# Patient Record
Sex: Female | Born: 1990
Health system: Southern US, Community
[De-identification: ages and names within clinical notes are randomized; demographics above are authoritative.]

## PROBLEM LIST (undated history)

## (undated) ENCOUNTER — Inpatient Hospital Stay (HOSPITAL_COMMUNITY): Payer: Self-pay

## (undated) DIAGNOSIS — F419 Anxiety disorder, unspecified: Secondary | ICD-10-CM

## (undated) DIAGNOSIS — Z9889 Other specified postprocedural states: Secondary | ICD-10-CM

## (undated) DIAGNOSIS — F32A Depression, unspecified: Secondary | ICD-10-CM

## (undated) DIAGNOSIS — R112 Nausea with vomiting, unspecified: Secondary | ICD-10-CM

## (undated) DIAGNOSIS — F329 Major depressive disorder, single episode, unspecified: Secondary | ICD-10-CM

## (undated) HISTORY — PX: CHOLECYSTECTOMY: SHX55

## (undated) HISTORY — PX: GALLBLADDER SURGERY: SHX652

---
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
## 2009-04-23 ENCOUNTER — Emergency Department (HOSPITAL_COMMUNITY)
Admission: EM | Admit: 2009-04-23 | Discharge: 2009-04-23 | Payer: Self-pay | Source: Home / Self Care | Admitting: Emergency Medicine

## 2010-02-13 ENCOUNTER — Ambulatory Visit (HOSPITAL_COMMUNITY)
Admission: RE | Admit: 2010-02-13 | Discharge: 2010-02-13 | Payer: Self-pay | Source: Home / Self Care | Attending: Obstetrics & Gynecology | Admitting: Obstetrics & Gynecology

## 2010-05-13 LAB — URINALYSIS, ROUTINE W REFLEX MICROSCOPIC
Bilirubin Urine: NEGATIVE
Hgb urine dipstick: NEGATIVE
Specific Gravity, Urine: 1.02 (ref 1.005–1.030)
pH: 7 (ref 5.0–8.0)

## 2010-05-13 LAB — GC/CHLAMYDIA PROBE AMP, GENITAL: Chlamydia, DNA Probe: POSITIVE — AB

## 2010-05-13 LAB — WET PREP, GENITAL: Yeast Wet Prep HPF POC: NONE SEEN

## 2010-05-13 LAB — PREGNANCY, URINE: Preg Test, Ur: NEGATIVE

## 2011-01-10 ENCOUNTER — Emergency Department (HOSPITAL_COMMUNITY)
Admission: EM | Admit: 2011-01-10 | Discharge: 2011-01-10 | Discharge status: Against Medical Advice | Payer: Self-pay | Attending: Emergency Medicine | Admitting: Emergency Medicine

## 2011-01-10 ENCOUNTER — Encounter: Payer: Self-pay | Admitting: *Deleted

## 2011-01-10 DIAGNOSIS — Z0389 Encounter for observation for other suspected diseases and conditions ruled out: Secondary | ICD-10-CM | POA: Insufficient documentation

## 2011-01-10 NOTE — ED Notes (Signed)
Pt reports being dx with gallstone Monday-was given referral to a surgeon-states calling the office-reports the office is closed.  Pt reports n/v and abd pain is getting worse.  Pt reports vomiting x 3 today.  Pt crying stating "i just want it taken out."  Pt reports she's unable to sleep at night d/t the pain.

## 2011-06-17 ENCOUNTER — Encounter (HOSPITAL_COMMUNITY): Payer: Self-pay | Admitting: *Deleted

## 2011-06-17 ENCOUNTER — Emergency Department (HOSPITAL_COMMUNITY): Payer: Self-pay

## 2011-06-17 ENCOUNTER — Emergency Department (HOSPITAL_COMMUNITY)
Admission: EM | Admit: 2011-06-17 | Discharge: 2011-06-17 | Disposition: A | Discharge status: Home / Self Care | Payer: Self-pay | Attending: Emergency Medicine | Admitting: Emergency Medicine

## 2011-06-17 DIAGNOSIS — M549 Dorsalgia, unspecified: Secondary | ICD-10-CM | POA: Insufficient documentation

## 2011-06-17 DIAGNOSIS — N898 Other specified noninflammatory disorders of vagina: Secondary | ICD-10-CM | POA: Insufficient documentation

## 2011-06-17 DIAGNOSIS — R1032 Left lower quadrant pain: Secondary | ICD-10-CM | POA: Insufficient documentation

## 2011-06-17 DIAGNOSIS — N939 Abnormal uterine and vaginal bleeding, unspecified: Secondary | ICD-10-CM

## 2011-06-17 DIAGNOSIS — R10819 Abdominal tenderness, unspecified site: Secondary | ICD-10-CM | POA: Insufficient documentation

## 2011-06-17 LAB — URINALYSIS, ROUTINE W REFLEX MICROSCOPIC
Bilirubin Urine: NEGATIVE
Ketones, ur: NEGATIVE mg/dL
Nitrite: NEGATIVE
Urobilinogen, UA: 1 mg/dL (ref 0.0–1.0)
pH: 6 (ref 5.0–8.0)

## 2011-06-17 LAB — URINE MICROSCOPIC-ADD ON

## 2011-06-17 LAB — WET PREP, GENITAL: Clue Cells Wet Prep HPF POC: NONE SEEN

## 2011-06-17 MED ORDER — OXYCODONE-ACETAMINOPHEN 5-325 MG PO TABS
1.0000 | ORAL_TABLET | Freq: Once | ORAL | Status: AC
  Administered 2011-06-17: 1 via ORAL
  Filled 2011-06-17: qty 1

## 2011-06-17 MED ORDER — OXYCODONE-ACETAMINOPHEN 5-325 MG PO TABS: 1.0000 | ORAL_TABLET | Freq: Four times a day (QID) | ORAL | Status: AC | PRN

## 2011-06-17 NOTE — ED Provider Notes (Signed)
History     CSN: [id number]  Arrival date & time 06/17/11  1335   First MD Initiated Contact with Patient 06/17/11 1347      No chief complaint on file.   (Consider location/radiation/quality/duration/timing/severity/associated sxs/prior treatment) Patient is a 21 y.o. female presenting with vaginal bleeding. The history is provided by the patient.  Vaginal Bleeding This is a recurrent problem. The current episode started in the past 7 days. The problem occurs intermittently. The problem has been unchanged. Associated symptoms include abdominal pain. Pertinent negatives include no chest pain, fatigue, headaches, nausea, neck pain, rash, vertigo, vomiting or weakness. Associated symptoms comments: No dizziness, syncope, dyspnea . The symptoms are aggravated by nothing. She has tried nothing for the symptoms. The treatment provided no relief.    History reviewed. No pertinent past medical history.  Past Surgical History  Procedure Date  . Appendectomy     No family history on file.  History  Substance Use Topics  . Smoking status: Current Everyday Smoker    Types: Cigarettes  . Smokeless tobacco: Not on file  . Alcohol Use: Yes     occasionally    OB History    Grav Para Term Preterm Abortions TAB SAB Ect Mult Living                  Review of Systems  Constitutional: Negative for fatigue.  HENT: Negative for neck pain.   Respiratory: Negative for shortness of breath.   Cardiovascular: Negative for chest pain.  Gastrointestinal: Positive for abdominal pain. Negative for nausea, vomiting and diarrhea.  Genitourinary: Positive for vaginal bleeding. Negative for dysuria.  Musculoskeletal: Positive for back pain (with cramps).  Skin: Negative for rash.  Neurological: Negative for vertigo, weakness and headaches.  All other systems reviewed and are negative.    Allergies  Amoxicillin and Penicillins  Home Medications   Current Outpatient Rx  Name Route Sig  Dispense Refill  . IBUPROFEN 800 MG PO TABS Oral Take 800 mg by mouth every 4 (four) hours as needed. As needed for pain.      BP 134/92  Pulse 115  Temp(Src) 97.7 F (36.5 C) (Oral)  Resp 22  SpO2 100%  Physical Exam  Nursing note and vitals reviewed. Constitutional: She is oriented to person, place, and time. She appears well-developed and well-nourished.  HENT:  Head: Normocephalic and atraumatic.  Eyes: EOM are normal.  Neck: Normal range of motion.  Cardiovascular: Normal rate, regular rhythm and normal heart sounds.   Pulmonary/Chest: Effort normal and breath sounds normal. No respiratory distress.  Abdominal: Soft. She exhibits no distension. There is tenderness in the suprapubic area. There is no rigidity, no guarding and no CVA tenderness.  Genitourinary: There is no lesion on the right labia. There is no lesion on the left labia. Uterus is not tender. Cervix exhibits no motion tenderness. Right adnexum displays no mass and no tenderness. Left adnexum displays tenderness. Left adnexum displays no mass. There is bleeding around the vagina.  Musculoskeletal: Normal range of motion.  Neurological: She is alert and oriented to person, place, and time.  Skin: Skin is warm and dry.  Psychiatric: She has a normal mood and affect.    ED Course  Procedures (including critical care time)  Labs Reviewed  URINALYSIS, ROUTINE W REFLEX MICROSCOPIC - Abnormal; Notable for the following:    Hgb urine dipstick SMALL (*)    All other components within normal limits  WET PREP, GENITAL - Abnormal; Notable for  the following:    WBC, Wet Prep HPF POC FEW (*)    All other components within normal limits  POCT PREGNANCY, URINE  URINE MICROSCOPIC-ADD ON  GC/CHLAMYDIA PROBE AMP, GENITAL   No results found.   1. Vaginal bleeding       MDM   Pt with h/o D&C mid Feb for nonviable IUP at [redacted]weeks gestation presents with heavy vaginal bleeding. Bleed for 3 weeks after procedure, then had  period end of march--heavier bleeding with increased cramps. Began with bleeding again several days ago. Also heavy, pad changes Q2h. Increased cramping from prior.  Abd exam benign.   UPT neg.   Pelvic with moderate vaginal bleeding from Os. Mild adnexal TTP without fullness.  Further hx from pt indicates her pregnancy was partially in fallopian tube, unsure of side. Given this, pain, and bleeding will check U/S.   To CDU for this study.          Donnamarie Poag, MD 06/17/11 [phone]

## 2011-06-17 NOTE — ED Provider Notes (Signed)
I saw and evaluated the patient, reviewed the resident's note and I agree with the findings and plan. Patient with left lower corner abdominal pain in the vaginal area that has worsened since her D&C. Significant pain in her left adnexa today. Ultrasound pending for further evaluation  Gwyneth Sprout, MD 06/17/11 1534

## 2011-06-17 NOTE — ED Notes (Signed)
Patient transported to Ultrasound 

## 2011-06-17 NOTE — ED Provider Notes (Signed)
Ultrasound results reviewed and discussed with patient.  Her prior d&c was performed by Dr Okey Dupre in [facility], but states she is no longer covered with him due to a  recent change in insurance.  Patient referred for follow-up with unassigned GYN Ernestina Penna).  Jimmye Norman, NP 06/17/11 [phone]

## 2011-06-17 NOTE — ED Notes (Signed)
Patient with tubal pregnancy requiring D/C appox 2 months ago, patient now with regular but heavy menstrual cycles.  Patient states passing large amounts of clots and has increasing pain in left lower abdomen radiating into back,

## 2011-06-17 NOTE — ED Notes (Signed)
Report called to CDU.

## 2011-06-17 NOTE — ED Notes (Signed)
Pt had D&C in feburary and since has been having cramping and vaginal clotting.

## 2011-06-17 NOTE — Discharge Instructions (Signed)

## 2011-06-18 LAB — GC/CHLAMYDIA PROBE AMP, GENITAL
Chlamydia, DNA Probe: NEGATIVE
GC Probe Amp, Genital: NEGATIVE

## 2011-06-18 NOTE — ED Provider Notes (Signed)
Medical screening examination/treatment/procedure(s) were conducted as a shared visit with non-physician practitioner(s) and myself.  I personally evaluated the patient during the encounter   Gwyneth Sprout, MD 06/18/11 [phone]

## 2012-02-07 ENCOUNTER — Encounter (HOSPITAL_COMMUNITY): Payer: Self-pay | Admitting: Emergency Medicine

## 2012-02-07 ENCOUNTER — Emergency Department (HOSPITAL_COMMUNITY)
Admission: EM | Admit: 2012-02-07 | Discharge: 2012-02-07 | Disposition: A | Discharge status: Home / Self Care | Payer: Medicaid Other | Attending: Emergency Medicine | Admitting: Emergency Medicine

## 2012-02-07 DIAGNOSIS — F329 Major depressive disorder, single episode, unspecified: Secondary | ICD-10-CM | POA: Insufficient documentation

## 2012-02-07 DIAGNOSIS — F17200 Nicotine dependence, unspecified, uncomplicated: Secondary | ICD-10-CM | POA: Insufficient documentation

## 2012-02-07 DIAGNOSIS — F410 Panic disorder [episodic paroxysmal anxiety] without agoraphobia: Secondary | ICD-10-CM | POA: Insufficient documentation

## 2012-02-07 DIAGNOSIS — F419 Anxiety disorder, unspecified: Secondary | ICD-10-CM

## 2012-02-07 DIAGNOSIS — G479 Sleep disorder, unspecified: Secondary | ICD-10-CM | POA: Insufficient documentation

## 2012-02-07 DIAGNOSIS — F3289 Other specified depressive episodes: Secondary | ICD-10-CM | POA: Insufficient documentation

## 2012-02-07 DIAGNOSIS — R0789 Other chest pain: Secondary | ICD-10-CM | POA: Insufficient documentation

## 2012-02-07 DIAGNOSIS — R0602 Shortness of breath: Secondary | ICD-10-CM | POA: Insufficient documentation

## 2012-02-07 DIAGNOSIS — Z3202 Encounter for pregnancy test, result negative: Secondary | ICD-10-CM | POA: Insufficient documentation

## 2012-02-07 DIAGNOSIS — F411 Generalized anxiety disorder: Secondary | ICD-10-CM | POA: Insufficient documentation

## 2012-02-07 LAB — POCT PREGNANCY, URINE: Preg Test, Ur: NEGATIVE

## 2012-02-07 LAB — URINALYSIS, ROUTINE W REFLEX MICROSCOPIC
Bilirubin Urine: NEGATIVE
Glucose, UA: NEGATIVE mg/dL
Specific Gravity, Urine: 1.03 (ref 1.005–1.030)
Urobilinogen, UA: 0.2 mg/dL (ref 0.0–1.0)

## 2012-02-07 LAB — URINE MICROSCOPIC-ADD ON

## 2012-02-07 MED ORDER — DIAZEPAM 5 MG PO TABS
5.0000 mg | ORAL_TABLET | Freq: Once | ORAL | Status: AC
  Administered 2012-02-07: 5 mg via ORAL
  Filled 2012-02-07: qty 1

## 2012-02-07 MED ORDER — LORAZEPAM 1 MG PO TABS
1.0000 mg | ORAL_TABLET | Freq: Once | ORAL | Status: AC
  Administered 2012-02-07: 1 mg via ORAL
  Filled 2012-02-07: qty 1

## 2012-02-07 MED ORDER — CLONAZEPAM 0.5 MG PO TABS: 1.0000 mg | ORAL_TABLET | Freq: Two times a day (BID) | ORAL | Status: DC | PRN

## 2012-02-07 NOTE — ED Notes (Signed)
Reports feels like freking out. Patient has never experienced this before. Heard a drink "pop" then vision tunneling, then spiraled out of control. Soon as she calms down then starts up again.

## 2012-02-07 NOTE — ED Provider Notes (Signed)
History     CSN: [id number]  Arrival date & time 02/07/12  1417   First MD Initiated Contact with Patient 02/07/12 1441      Chief Complaint  Patient presents with  . Anxiety    (Consider location/radiation/quality/duration/timing/severity/associated sxs/prior treatment) HPI Comments: [name] is a 21 y.o. female with a history of anxiety and depression presents emergency department having an acute panic attack.  Symptoms began while at work and patient describes them as "freaking out and spiraling out of control."  Patient did not recall when her last anxiety/panic attack was and is not currently on any medications for depression.  She reports having increased stress, decreased sleep and worsened depression over the last couple of weeks.  Patient describes a chest tightness sensation and palpitations associated with a tach and she is currently still having the symptoms.  Patient states that she should not be pregnant as she has an  Implanon implant for birth control.   The history is provided by the patient.    History reviewed. No pertinent past medical history.  Past Surgical History  Procedure Date  . Appendectomy     History reviewed. No pertinent family history.  History  Substance Use Topics  . Smoking status: Current Every Day Smoker    Types: Cigarettes  . Smokeless tobacco: Not on file  . Alcohol Use: Yes     Comment: occasionally    OB History    Grav Para Term Preterm Abortions TAB SAB Ect Mult Living                  Review of Systems  Constitutional: Negative for fever, chills and appetite change.  HENT: Negative for congestion.   Eyes: Negative for visual disturbance.  Respiratory: Positive for chest tightness and shortness of breath.   Cardiovascular: Negative for chest pain and leg swelling.  Gastrointestinal: Negative for abdominal pain.  Genitourinary: Negative for dysuria, urgency and frequency.  Neurological: Negative for  dizziness, syncope, weakness, light-headedness, numbness and headaches.  Psychiatric/Behavioral: Positive for sleep disturbance. Negative for confusion. The patient is nervous/anxious.     Allergies  Amoxicillin; Penicillins; Codeine; and Morphine and related  Home Medications  No current outpatient prescriptions on file.  BP 158/81  Pulse 111  Temp 98.1 F (36.7 C) (Oral)  Resp 28  Ht 5\' 4"  (1.626 m)  Wt 160 lb (72.576 kg)  BMI 27.46 kg/m2  SpO2 100%  Physical Exam  Nursing note and vitals reviewed. Constitutional: She appears well-developed and well-nourished. She appears distressed.       Tearful   HENT:  Head: Normocephalic and atraumatic.  Eyes: Conjunctivae normal and EOM are normal. Pupils are equal, round, and reactive to light.  Neck: Normal range of motion. Neck supple. Normal carotid pulses and no JVD present. Carotid bruit is not present. No rigidity. Normal range of motion present.  Cardiovascular: Regular rhythm, S1 normal, S2 normal, normal heart sounds, intact distal pulses and normal pulses.  Exam reveals no gallop and no friction rub.   No murmur heard.      Tachycardia, No pitting edema bilaterally, no aberrant sounds on auscultations, distal pulses intact, no carotid bruit or JVD.   Pulmonary/Chest: Effort normal and breath sounds normal. No accessory muscle usage or stridor. No respiratory distress. She exhibits no tenderness and no bony tenderness.  Abdominal: Bowel sounds are normal.       Soft non tender. Non pulsatile aorta.   Skin: Skin is warm, dry and  intact. No rash noted. She is not diaphoretic. No cyanosis. Nails show no clubbing.  Psychiatric: Her speech is normal. Her mood appears anxious. She exhibits a depressed mood. She expresses no homicidal and no suicidal ideation.    ED Course  Procedures (including critical care time)   Labs Reviewed  POCT PREGNANCY, URINE  URINALYSIS, ROUTINE W REFLEX MICROSCOPIC   No results found.   No  diagnosis found.    MDM  Patient presents to the emergency department complaining of symptoms consistent with anxiety.  Patient has a history of same with similar episodes.  Pt was on antidepressent in past but has not taked over the last couple of years and requests resource guide. The patient is resting comfortably, in no apparent distress and asymptomatic.  No exophthalmos, pregnancy test negative, no signs of UTI.  Stress reducing mechanisms discussed including caffeine intake.  Patient has been referred to psychiatric services for follow-up.  Discharged with a 3 day prescription for Ativan 1mg .             Jaci Carrel, [place] 02/07/12 1650

## 2012-02-08 NOTE — ED Provider Notes (Signed)
Medical screening examination/treatment/procedure(s) were performed by non-physician practitioner and as supervising physician I was immediately available for consultation/collaboration.   Macky Galik, MD 02/08/12 0734 

## 2012-02-09 ENCOUNTER — Ambulatory Visit (HOSPITAL_COMMUNITY)
Admission: RE | Admit: 2012-02-09 | Discharge: 2012-02-09 | Disposition: A | Discharge status: Home / Self Care | Payer: Medicaid Other | Attending: Psychiatry | Admitting: Psychiatry

## 2012-02-09 ENCOUNTER — Encounter (HOSPITAL_COMMUNITY): Payer: Self-pay | Admitting: Behavioral Health

## 2012-02-09 DIAGNOSIS — F3289 Other specified depressive episodes: Secondary | ICD-10-CM | POA: Insufficient documentation

## 2012-02-09 DIAGNOSIS — F329 Major depressive disorder, single episode, unspecified: Secondary | ICD-10-CM | POA: Insufficient documentation

## 2012-02-09 HISTORY — DX: Major depressive disorder, single episode, unspecified: F32.9

## 2012-02-09 HISTORY — DX: Depression, unspecified: F32.A

## 2012-02-09 NOTE — BH Assessment (Signed)
Assessment Note   [name] is an 21 y.o. female that arrived to [facility] for an assessment following a [facility] admission 12/21 for panic, anxiety symptoms and was prescribed clonazepam .05mg . Pt is oriented x's 4, calm and cooperative. Pt reports that she was referred to [facility] iop services to address her depression and anxiety that is currently increasing in intensity. Pt denies SI, HI, AV, delusions, psychosis, sexual, physical, emotional and sa abuse. Pt reports that while at work she experienced a "pop" sound and a "tunneling/narrowing" effect with her vision and said "I've never experienced that before". Pt reports depressive symptoms of insomnia (3/24 hrs), tearful, fatigue, loss of interest in pleasures and irritable and decreased concentration. Pt reports that she remembers being dx'd with depression at about 21 yo. Pt confirms intact recent/remote memory, good appetite, zero wght loss and a 6-7 lb gain in 30 days. 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   Pt reports her paternal grandma (raised her) passed 2 yrs ago and she never received grief therapy afterwards. Pt reports after having her son 5 yrs ago she experienced post-partum depression and she never received the diagnosis specific therapy. Pt reports that she was married at 66, separated during the last 5 yrs and is newly reunited with her husband. Pt reports that she is concerned about paying her bills and she becomes overwhelmed. Pt reports that she is off the next 2 days and said "it might not be much for most people, but I need my 2 days pay". Pt reports that she is eager to address the cause of her growing depression and anxiety.  Axis I: Depressive Disorder NOS Axis II: Deferred Axis III:  Past Medical History  Diagnosis Date  . Depression    Axis IV: occupational problems, problems related to social environment and problems with access to health care  services Axis V: 41-50 serious symptoms  Past Medical History:  Past Medical History  Diagnosis Date  . Depression     Past Surgical History  Procedure Date  . Appendectomy     Family History: No family history on file.  Social History:  reports that she has been smoking Cigarettes.  She does not have any smokeless tobacco history on file. She reports that she drinks alcohol. She reports that she does not use illicit drugs.  Additional Social History:  Alcohol / Drug Use Pain Medications: none Prescriptions: none Over the Counter: none History of alcohol / drug use?: No history of alcohol / drug abuse  CIWA: CIWA-Ar Nausea and Vomiting: no nausea and no vomiting Tactile Disturbances: none Tremor: no tremor Auditory Disturbances: not present Paroxysmal Sweats: no sweat visible Visual Disturbances: not present Anxiety: no anxiety, at ease Headache, Fullness in Head: none present Agitation: normal activity Orientation and Clouding of Sensorium: oriented and can do serial additions CIWA-Ar Total: 0  COWS: Clinical Opiate Withdrawal Scale (COWS) Resting Pulse Rate: Pulse Rate 80 or below Sweating: No report of chills or flushing Restlessness: Able to sit still Pupil Size: Pupils pinned or normal size for room light Bone or Joint Aches: Not present Runny Nose or Tearing: Not present GI Upset: No GI symptoms Tremor: No tremor Yawning: No yawning Anxiety or Irritability: None Gooseflesh Skin: Skin is smooth COWS Total Score: 0   Allergies:  Allergies  Allergen Reactions  . Amoxicillin Anaphylaxis  . Penicillins Anaphylaxis  . Codeine Itching  . Morphine And Related Itching  Home Medications:  (Not in a hospital admission)  OB/GYN Status:  No LMP recorded.  General Assessment Data Location of Assessment: [facility] Assessment Services Living Arrangements: Spouse/significant other;Children Can pt return to current living arrangement?: Yes Admission Status:  Voluntary Is patient capable of signing voluntary admission?: Yes Transfer from: Home Referral Source: Self/Family/Friend  Education Status Is patient currently in school?: No  Risk to self Suicidal Ideation: No Suicidal Intent: No Is patient at risk for suicide?: No Suicidal Plan?: No Access to Means: No What has been your use of drugs/alcohol within the last 12 months?:  (none) Previous Attempts/Gestures: No Other Self Harm Risks:  (none) Triggers for Past Attempts: None known Intentional Self Injurious Behavior: None Family Suicide History: No Recent stressful life event(s): Conflict (Comment);Other (Comment) (newly reunited w/husband, stress at work) Persecutory voices/beliefs?: No Depression: Yes Depression Symptoms: Insomnia;Tearfulness;Fatigue;Loss of interest in usual pleasures;Feeling angry/irritable (3/24 hrs, not participating w/friends, irritable) Substance abuse history and/or treatment for substance abuse?: No Suicide prevention information given to non-admitted patients: Not applicable  Risk to Others Homicidal Ideation: No Thoughts of Harm to Others: No Current Homicidal Intent: No Current Homicidal Plan: No Access to Homicidal Means: No Identified Victim:  (none) History of harm to others?: No Assessment of Violence: None Noted Violent Behavior Description:  (calm and cooperative) Does patient have access to weapons?: No Criminal Charges Pending?: No Does patient have a court date: No  Psychosis Hallucinations: None noted Delusions: None noted  Mental Status Report Appear/Hygiene:  (casual) Eye Contact: Good Motor Activity: Freedom of movement Speech: Logical/coherent Level of Consciousness: Alert;Quiet/awake Mood: Depressed Affect: Appropriate to circumstance Anxiety Level: Minimal Thought Processes: Coherent;Relevant Judgement: Unimpaired Orientation: Person;Place;Time;Situation;Appropriate for developmental age Obsessive Compulsive  Thoughts/Behaviors: None  Cognitive Functioning Concentration: Decreased Memory: Recent Intact;Remote Intact IQ: Average Insight: Good Impulse Control: Good Appetite: Good Weight Loss:  (0) Weight Gain:  (0) Sleep: Decreased Total Hours of Sleep:  (3/24) Vegetative Symptoms: None  ADLScreening [facility] Assessment Services) Patient's cognitive ability adequate to safely complete daily activities?: Yes Patient able to express need for assistance with ADLs?: Yes Independently performs ADLs?: Yes (appropriate for developmental age)  Abuse/Neglect [facility]) Physical Abuse: Denies Verbal Abuse: Denies Sexual Abuse: Denies  Prior Inpatient Therapy Prior Inpatient Therapy: No  Prior Outpatient Therapy Prior Outpatient Therapy: Yes Prior Therapy Dates:  (2003/2004) Prior Therapy Facilty/Provider(s):  ([address], pt not sure of provider name) Reason for Treatment:  (anger, depression)  ADL Screening (condition at time of admission) Patient's cognitive ability adequate to safely complete daily activities?: Yes Patient able to express need for assistance with ADLs?: Yes Independently performs ADLs?: Yes (appropriate for developmental age) Weakness of Legs: None Weakness of Arms/Hands: None  Home Assistive Devices/Equipment Home Assistive Devices/Equipment: None  Therapy Consults (therapy consults require a physician order) PT Evaluation Needed: No OT Evalulation Needed: No Abuse/Neglect Assessment (Assessment to be complete while patient is alone) Physical Abuse: Denies Verbal Abuse: Denies Sexual Abuse: Denies Exploitation of patient/patient's resources: Denies Self-Neglect: Denies Values / Beliefs Cultural Requests During Hospitalization: None Spiritual Requests During Hospitalization: None Consults Spiritual Care Consult Needed: No Social Work Consult Needed: No Merchant navy officer (For Healthcare) Advance Directive: Patient does not have advance directive Pre-existing out  of facility DNR order (yellow form or pink MOST form): No Nutrition Screen- [facility] Adult/[facility] Patient's home diet: Regular Have you recently lost weight without trying?: No Have you been eating poorly because of a decreased appetite?: No Malnutrition Screening Tool Score: 0   Additional Information 1:1  In Past 12 Months?: No CIRT Risk: No Elopement Risk: No Does patient have medical clearance?: No     Disposition: pt assessed for MH IOP after [facility] referral. Disposition Disposition of Patient: Outpatient treatment Type of outpatient treatment: Adult;Psych Intensive Outpatient  On Site Evaluation by:   Reviewed with Physician:     Manual Meier 02/09/2012 7:42 PM

## 2012-05-05 ENCOUNTER — Emergency Department (HOSPITAL_COMMUNITY): Payer: Medicaid Other

## 2012-05-05 ENCOUNTER — Emergency Department (HOSPITAL_COMMUNITY)
Admission: EM | Admit: 2012-05-05 | Discharge: 2012-05-05 | Disposition: A | Discharge status: Home / Self Care | Payer: Medicaid Other | Attending: Emergency Medicine | Admitting: Emergency Medicine

## 2012-05-05 ENCOUNTER — Encounter (HOSPITAL_COMMUNITY): Payer: Self-pay | Admitting: *Deleted

## 2012-05-05 DIAGNOSIS — Y9389 Activity, other specified: Secondary | ICD-10-CM | POA: Insufficient documentation

## 2012-05-05 DIAGNOSIS — W010XXA Fall on same level from slipping, tripping and stumbling without subsequent striking against object, initial encounter: Secondary | ICD-10-CM | POA: Insufficient documentation

## 2012-05-05 DIAGNOSIS — F17200 Nicotine dependence, unspecified, uncomplicated: Secondary | ICD-10-CM | POA: Insufficient documentation

## 2012-05-05 DIAGNOSIS — Y929 Unspecified place or not applicable: Secondary | ICD-10-CM | POA: Insufficient documentation

## 2012-05-05 DIAGNOSIS — IMO0002 Reserved for concepts with insufficient information to code with codable children: Secondary | ICD-10-CM | POA: Insufficient documentation

## 2012-05-05 DIAGNOSIS — T148XXA Other injury of unspecified body region, initial encounter: Secondary | ICD-10-CM

## 2012-05-05 DIAGNOSIS — Z8659 Personal history of other mental and behavioral disorders: Secondary | ICD-10-CM | POA: Insufficient documentation

## 2012-05-05 DIAGNOSIS — M545 Low back pain: Secondary | ICD-10-CM

## 2012-05-05 MED ORDER — IBUPROFEN 400 MG PO TABS
800.0000 mg | ORAL_TABLET | Freq: Once | ORAL | Status: AC
  Administered 2012-05-05: 800 mg via ORAL
  Filled 2012-05-05: qty 2

## 2012-05-05 MED ORDER — METHOCARBAMOL 500 MG PO TABS: 500.0000 mg | ORAL_TABLET | Freq: Two times a day (BID) | ORAL | Status: DC

## 2012-05-05 MED ORDER — IBUPROFEN 800 MG PO TABS: 800.0000 mg | ORAL_TABLET | Freq: Three times a day (TID) | ORAL | Status: DC | PRN

## 2012-05-05 NOTE — ED Notes (Signed)
Pt st's she slid and fell 3 days ago.  Pt c/o pain in lower back radiating down right leg.

## 2012-05-05 NOTE — ED Notes (Signed)
Pt reports falling 3 days ago but still having lower back pain that radiates into her right thigh. Ambulatory at triage.

## 2012-05-05 NOTE — ED Provider Notes (Signed)
History    This chart was scribed for non-physician practitioner working with Stacey Razor, MD by ED Scribe, Burman Nieves. This patient was seen in room TR05C/TR05C and the patient's care was started at 5:53 PM.   CSN: [id number]  Arrival date & time 05/05/12  1548   First MD Initiated Contact with Patient 05/05/12 1753      Chief Complaint  Patient presents with  . Fall  . Back Pain    (Consider location/radiation/quality/duration/timing/severity/associated sxs/prior treatment) Patient is a 22 y.o. female presenting with fall and back pain. The history is provided by the patient. No language interpreter was used.  Fall Pertinent negatives include no fever, no abdominal pain, no nausea, no vomiting, no hematuria and no headaches.  Back Pain Associated symptoms: no abdominal pain, no chest pain, no dysuria, no fever and no headaches    [name] is a 22 y.o. female who presents to the Emergency Department complaining of moderate constant lower back pain onset 3 days ago. Pt went out Sunday night drinking with friends when pt slipped and fell backwards landing on her lower back. She woke up the next day with persistent pain in her right lower back radiating into her lower right hip. The pain sensation is a stabbing pain. She went to [facility] and doctor suggested Excedrin and Heating Pads, which provided her with no relief. Pt was ambulatory after incident and denies any HI or LOC. She complains that pain has gotten so bad she cannot sit or get comfortable. Pt denies any bladder/bowel incontinence, fever, chills, cough, nausea, vomiting, diarrhea, SOB, weakness, and any other associated symptoms.   Past Medical History  Diagnosis Date  . Depression     Past Surgical History  Procedure Laterality Date  . Appendectomy      History reviewed. No pertinent family history.  History  Substance Use Topics  . Smoking status: Current Every Day Smoker    Types:  Cigarettes  . Smokeless tobacco: Not on file  . Alcohol Use: Yes     Comment: occasionally    OB History   Grav Para Term Preterm Abortions TAB SAB Ect Mult Living                  Review of Systems  Constitutional: Negative for fever and chills.  HENT: Negative for sore throat and rhinorrhea.   Respiratory: Negative for cough and shortness of breath.   Cardiovascular: Negative for chest pain.  Gastrointestinal: Negative for nausea, vomiting, abdominal pain and diarrhea.  Genitourinary: Negative for dysuria and hematuria.  Musculoskeletal: Positive for myalgias, back pain and arthralgias.  Neurological: Negative for dizziness, light-headedness and headaches.  Psychiatric/Behavioral: Negative for confusion.    Allergies  Amoxicillin; Penicillins; Codeine; and Morphine and related  Home Medications  No current outpatient prescriptions on file.  BP 125/95  Pulse 80  Temp(Src) 98 F (36.7 C) (Oral)  Resp 18  SpO2 98%  LMP 04/12/2012  Physical Exam  Nursing note and vitals reviewed. Constitutional: She is oriented to person, place, and time. She appears well-developed and well-nourished. No distress.  HENT:  Head: Normocephalic and atraumatic.  Right Ear: External ear normal.  Left Ear: External ear normal.  Mouth/Throat: Oropharynx is clear and moist. No oropharyngeal exudate.  Eyes: Conjunctivae and EOM are normal. Pupils are equal, round, and reactive to light.  Neck: Normal range of motion. Neck supple. No tracheal deviation present.  Cardiovascular: Normal rate, regular rhythm and normal heart sounds.  No murmur heard. Pulmonary/Chest: Effort normal and breath sounds normal. No respiratory distress. She has no rales.  Abdominal: Soft. Bowel sounds are normal. There is no tenderness. There is no guarding.  Musculoskeletal: Normal range of motion. She exhibits tenderness.  Midline spine tenderness to the lumbar region. Paraspinal tenderness to lumbar spine region.    Neurological: She is alert and oriented to person, place, and time. No cranial nerve deficit. She exhibits normal muscle tone. Coordination normal.  Skin: Skin is warm and dry. No rash noted. No erythema.  Psychiatric: She has a normal mood and affect. Her behavior is normal.    ED Course  Procedures (including critical care time) DIAGNOSTIC STUDIES: Oxygen Saturation is 98% on room air, normal by my interpretation.    COORDINATION OF CARE:  6:51 PM Discussed ED treatment with pt and pt agrees.    Labs Reviewed - No data to display Dg Lumbar Spine Complete  05/05/2012  *RADIOLOGY REPORT*  Clinical Data: Fall.  Right-sided sacral pain.  LUMBAR SPINE - COMPLETE 4+ VIEW  Comparison: None.  Findings: Five non-rib bearing lumbar type vertebral bodies are present.  The vertebral body heights and alignment are maintained. There is some straightening of the normal lumbar lordosis.  No acute bone or soft tissue abnormality is present.  IMPRESSION:  1.  No acute fracture or traumatic subluxation. 2.  Straightening of the normal lumbar lordosis.  This is nonspecific, but can be seen in the setting of muscle strain or ongoing pain.   Original Report Authenticated By: Marin Roberts, M.D.      1. Low back pain   2. Muscle strain     BP 125/95  Pulse 80  Temp(Src) 98 F (36.7 C) (Oral)  Resp 18  SpO2 98%  LMP 04/12/2012  I have reviewed nursing notes and vital signs. I personally reviewed the imaging tests through PACS system  I reviewed available ER/hospitalization records thought the EMR   MDM  I personally performed the services described in this documentation, which was scribed in my presence. The recorded information has been reviewed and is accurate.          Fayrene Helper, PA-C 05/05/12 1921

## 2012-05-06 NOTE — ED Provider Notes (Signed)
Medical screening examination/treatment/procedure(s) were performed by non-physician practitioner and as supervising physician I was immediately available for consultation/collaboration.  Raeford Razor, MD 05/06/12 [phone]

## 2012-06-09 ENCOUNTER — Inpatient Hospital Stay (HOSPITAL_COMMUNITY)
Admission: AD | Admit: 2012-06-09 | Discharge: 2012-06-09 | Disposition: A | Discharge status: Home / Self Care | Payer: Medicaid Other | Source: Ambulatory Visit | Attending: Obstetrics & Gynecology | Admitting: Obstetrics & Gynecology

## 2012-06-09 ENCOUNTER — Encounter (HOSPITAL_COMMUNITY): Payer: Self-pay

## 2012-06-09 ENCOUNTER — Inpatient Hospital Stay (HOSPITAL_COMMUNITY): Payer: Medicaid Other

## 2012-06-09 ENCOUNTER — Emergency Department (HOSPITAL_COMMUNITY)
Admission: EM | Admit: 2012-06-09 | Discharge: 2012-06-09 | Discharge status: Absent without leave | Payer: Medicaid Other

## 2012-06-09 DIAGNOSIS — O2390 Unspecified genitourinary tract infection in pregnancy, unspecified trimester: Secondary | ICD-10-CM | POA: Insufficient documentation

## 2012-06-09 DIAGNOSIS — A499 Bacterial infection, unspecified: Secondary | ICD-10-CM | POA: Insufficient documentation

## 2012-06-09 DIAGNOSIS — R109 Unspecified abdominal pain: Secondary | ICD-10-CM | POA: Insufficient documentation

## 2012-06-09 DIAGNOSIS — O209 Hemorrhage in early pregnancy, unspecified: Secondary | ICD-10-CM | POA: Insufficient documentation

## 2012-06-09 DIAGNOSIS — Z3201 Encounter for pregnancy test, result positive: Secondary | ICD-10-CM

## 2012-06-09 DIAGNOSIS — N760 Acute vaginitis: Secondary | ICD-10-CM | POA: Insufficient documentation

## 2012-06-09 DIAGNOSIS — B9689 Other specified bacterial agents as the cause of diseases classified elsewhere: Secondary | ICD-10-CM | POA: Insufficient documentation

## 2012-06-09 LAB — URINE MICROSCOPIC-ADD ON

## 2012-06-09 LAB — WET PREP, GENITAL: Yeast Wet Prep HPF POC: NONE SEEN

## 2012-06-09 LAB — URINALYSIS, ROUTINE W REFLEX MICROSCOPIC
Glucose, UA: NEGATIVE mg/dL
Leukocytes, UA: NEGATIVE
pH: 6 (ref 5.0–8.0)

## 2012-06-09 LAB — CBC
MCH: 30.1 pg (ref 26.0–34.0)
MCHC: 35.9 g/dL (ref 30.0–36.0)
MCV: 83.9 fL (ref 78.0–100.0)
Platelets: 230 10*3/uL (ref 150–400)
RDW: 12.6 % (ref 11.5–15.5)

## 2012-06-09 MED ORDER — METRONIDAZOLE 500 MG PO TABS: 500.0000 mg | ORAL_TABLET | Freq: Two times a day (BID) | ORAL | Status: DC

## 2012-06-09 NOTE — MAU Provider Note (Signed)
History     CSN: [id number]  Arrival date and time: 06/09/12 8295   First Provider Initiated Contact with Patient 06/09/12 1017      Chief Complaint  Patient presents with  . Abdominal Cramping  . Vaginal Bleeding   HPI Ms. [name] is a 22 y.o. G2P1001 at [redacted]w[redacted]d who presents to MAU today with complaint of vaginal bleeding and abdominal pain. She had +HPT. LMP was 05/19/12. Cramping was severe this morning and noticed spotting when wiping at that time. Pain is 5/10 now. She has had some nausea without vomiting. Denies fever, dizziness or UTI symptoms.    OB History   Grav Para Term Preterm Abortions TAB SAB Ect Mult Living   2 1 1       1       Past Medical History  Diagnosis Date  . Depression     Past Surgical History  Procedure Laterality Date  . Appendectomy      History reviewed. No pertinent family history.  History  Substance Use Topics  . Smoking status: Current Every Day Smoker    Types: Cigarettes  . Smokeless tobacco: Not on file  . Alcohol Use: Yes     Comment: occasionally    Allergies:  Allergies  Allergen Reactions  . Amoxicillin Anaphylaxis  . Penicillins Anaphylaxis  . Codeine Itching  . Morphine And Related Itching    Prescriptions prior to admission  Medication Sig Dispense Refill  . acetaminophen (TYLENOL) 325 MG tablet Take 650 mg by mouth once.        Review of Systems  Constitutional: Negative for fever and malaise/fatigue.  Gastrointestinal: Positive for nausea and abdominal pain. Negative for vomiting, diarrhea and constipation.  Genitourinary: Negative for dysuria, urgency and frequency.       + vaginal bleeding, discharge  Neurological: Negative for dizziness, loss of consciousness and weakness.   Physical Exam   Height 5\' 5"  (1.651 m), weight 157 lb 4 oz (71.328 kg), last menstrual period 05/19/2012.  Physical Exam  Constitutional: She is oriented to person, place, and time. She appears well-developed and  well-nourished. No distress.  HENT:  Head: Normocephalic and atraumatic.  Cardiovascular: Normal rate, regular rhythm and normal heart sounds.   Respiratory: Effort normal and breath sounds normal. No respiratory distress.  GI: Soft. Bowel sounds are normal. She exhibits no distension and no mass. There is tenderness (moderate tenderness to the lower abdomen). There is no rebound and no guarding.  Genitourinary: Uterus is tender. Uterus is not enlarged. Cervix exhibits no motion tenderness, no discharge and no friability. Right adnexum displays tenderness. Right adnexum displays no mass. Left adnexum displays no mass and no tenderness. There is bleeding around the vagina. No tenderness around the vagina. Vaginal discharge (small amount of pink discharge noted) found.  Neurological: She is alert and oriented to person, place, and time.  Skin: Skin is warm and dry. No erythema.  Psychiatric: She has a normal mood and affect.   Results for orders placed during the hospital encounter of 06/09/12 (from the past 24 hour(s))  URINALYSIS, ROUTINE W REFLEX MICROSCOPIC     Status: Abnormal   Collection Time    06/09/12  9:50 AM      Result Value Range   Color, Urine YELLOW  YELLOW   APPearance CLEAR  CLEAR   Specific Gravity, Urine 1.025  1.005 - 1.030   pH 6.0  5.0 - 8.0   Glucose, UA NEGATIVE  NEGATIVE mg/dL   Hgb  urine dipstick SMALL (*) NEGATIVE   Bilirubin Urine NEGATIVE  NEGATIVE   Ketones, ur NEGATIVE  NEGATIVE mg/dL   Protein, ur NEGATIVE  NEGATIVE mg/dL   Urobilinogen, UA 0.2  0.0 - 1.0 mg/dL   Nitrite NEGATIVE  NEGATIVE   Leukocytes, UA NEGATIVE  NEGATIVE  URINE MICROSCOPIC-ADD ON     Status: Abnormal   Collection Time    06/09/12  9:50 AM      Result Value Range   Squamous Epithelial / LPF FEW (*) RARE   WBC, UA 0-2  <3 WBC/hpf   RBC / HPF 3-6  <3 RBC/hpf   Bacteria, UA MANY (*) RARE   Urine-Other MUCOUS PRESENT    POCT PREGNANCY, URINE     Status: Abnormal   Collection Time     06/09/12 10:06 AM      Result Value Range   Preg Test, Ur POSITIVE (*) NEGATIVE  HCG, QUANTITATIVE, PREGNANCY     Status: Abnormal   Collection Time    06/09/12 10:20 AM      Result Value Range   hCG, Beta Chain, Quant, S 130 (*) <5 mIU/mL  WET PREP, GENITAL     Status: Abnormal   Collection Time    06/09/12 10:21 AM      Result Value Range   Yeast Wet Prep HPF POC NONE SEEN  NONE SEEN   Trich, Wet Prep NONE SEEN  NONE SEEN   Clue Cells Wet Prep HPF POC MODERATE (*) NONE SEEN   WBC, Wet Prep HPF POC MANY (*) NONE SEEN  ABO/RH     Status: None   Collection Time    06/09/12 10:21 AM      Result Value Range   ABO/RH(D) A POS    CBC     Status: Abnormal   Collection Time    06/09/12 10:24 AM      Result Value Range   WBC 11.0 (*) 4.0 - 10.5 K/uL   RBC 4.85  3.87 - 5.11 MIL/uL   Hemoglobin 14.6  12.0 - 15.0 g/dL   HCT 62.1  30.8 - 65.7 %   MCV 83.9  78.0 - 100.0 fL   MCH 30.1  26.0 - 34.0 pg   MCHC 35.9  30.0 - 36.0 g/dL   RDW 84.6  96.2 - 95.2 %   Platelets 230  150 - 400 K/uL    MAU Course  Procedures None  MDM Wet prep, GC/Chlamydia, CBC, ABO/Rh, Quant hCG and [place] today  Assessment and Plan  A: Positive pregnancy test Bleeding in early pregnancy Abdominal pain in pregnancy Bacterial vaginosis  P: Discharge home Rx for Flagyl sent to patient's pharmacy Patient to return to MAU in 48 hours for repeat quant hCG Bleeding precautions discussed Patient may return to MAU as needed sooner  Freddi Starr, PA-C 06/09/2012, 11:48 AM

## 2012-06-09 NOTE — MAU Note (Signed)
Patient is in with c/o painful lower abdominal cramping and spotting that started this morning. She states that her lmp 4/2 but was only 3 days long and was heavier than usual.

## 2012-06-11 ENCOUNTER — Encounter (HOSPITAL_COMMUNITY): Payer: Self-pay | Admitting: Advanced Practice Midwife

## 2012-06-11 ENCOUNTER — Inpatient Hospital Stay (HOSPITAL_COMMUNITY)
Admission: AD | Admit: 2012-06-11 | Discharge: 2012-06-11 | DRG: 781 | Disposition: A | Discharge status: Home / Self Care | Payer: Medicaid Other | Source: Ambulatory Visit | Attending: Obstetrics & Gynecology | Admitting: Obstetrics & Gynecology

## 2012-06-11 DIAGNOSIS — O99891 Other specified diseases and conditions complicating pregnancy: Secondary | ICD-10-CM | POA: Diagnosis present

## 2012-06-11 DIAGNOSIS — O200 Threatened abortion: Secondary | ICD-10-CM

## 2012-06-11 DIAGNOSIS — R109 Unspecified abdominal pain: Secondary | ICD-10-CM | POA: Diagnosis present

## 2012-06-11 LAB — HCG, QUANTITATIVE, PREGNANCY: hCG, Beta Chain, Quant, S: 430 m[IU]/mL — ABNORMAL HIGH (ref <5)

## 2012-06-11 NOTE — MAU Provider Note (Signed)
22 y.o. G2P1001 at [redacted]w[redacted]d here for repeat quant, reports mild cramping, no bleeding. Quant 130 on 4/23.   BP 122/73  Pulse 110  Temp(Src) 98 F (36.7 C) (Oral)  Resp 16  SpO2 100%  LMP 05/19/2012  Gen: well, no distress  Results for orders placed during the hospital encounter of 06/11/12 (from the past 24 hour(s))  HCG, QUANTITATIVE, PREGNANCY     Status: Abnormal   Collection Time    06/11/12  1:18 PM      Result Value Range   hCG, Beta Chain, Quant, S 430 (*) <5 mIU/mL    A/P: 22 y.o. G2P1001, cramping in early pregnancy with appropriately rising quant Repeat u/s for viability in 1 week, precautions rev'd

## 2012-06-11 NOTE — MAU Note (Signed)
Patient to MAU for repeat BHCG. Patient states she still has a little cramping but no bleeding. Slight nausea, no vomiting. [name]

## 2012-06-14 NOTE — MAU Provider Note (Signed)
Attestation of Attending Supervision of Advanced Practitioner (CNM/NP): Evaluation and management procedures were performed by the Advanced Practitioner under my supervision and collaboration.  I have reviewed the Advanced Practitioner's note and chart, and I agree with the management and plan.  HARRAWAY-SMITH, Husam Hohn 4:54 PM

## 2012-06-18 ENCOUNTER — Ambulatory Visit (HOSPITAL_COMMUNITY)
Admission: RE | Admit: 2012-06-18 | Discharge: 2012-06-18 | Disposition: A | Discharge status: Home / Self Care | Payer: Medicaid Other | Source: Ambulatory Visit | Attending: Advanced Practice Midwife | Admitting: Advanced Practice Midwife

## 2012-06-18 ENCOUNTER — Encounter (HOSPITAL_COMMUNITY): Payer: Self-pay | Admitting: Advanced Practice Midwife

## 2012-06-18 ENCOUNTER — Inpatient Hospital Stay (HOSPITAL_COMMUNITY)
Admission: AD | Admit: 2012-06-18 | Discharge: 2012-06-18 | Disposition: A | Discharge status: Home / Self Care | Payer: Medicaid Other | Source: Ambulatory Visit | Attending: Obstetrics & Gynecology | Admitting: Obstetrics & Gynecology

## 2012-06-18 DIAGNOSIS — Z3490 Encounter for supervision of normal pregnancy, unspecified, unspecified trimester: Secondary | ICD-10-CM

## 2012-06-18 DIAGNOSIS — O99891 Other specified diseases and conditions complicating pregnancy: Secondary | ICD-10-CM | POA: Insufficient documentation

## 2012-06-18 DIAGNOSIS — Z3689 Encounter for other specified antenatal screening: Secondary | ICD-10-CM | POA: Insufficient documentation

## 2012-06-18 DIAGNOSIS — Z1389 Encounter for screening for other disorder: Secondary | ICD-10-CM

## 2012-06-18 DIAGNOSIS — O200 Threatened abortion: Secondary | ICD-10-CM

## 2012-06-18 NOTE — MAU Provider Note (Signed)
[name] is a 22 y.o. G2P1001 at [redacted]w[redacted]d here for follow up u/s for viability. Denies pain or bleeding today.   BP 119/77  Pulse 85  Temp(Src) 98.7 F (37.1 C) (Oral)  Resp 16  Ht 5' 4.5" (1.638 m)  Wt 153 lb 2 oz (69.457 kg)  BMI 25.89 kg/m2  LMP 05/19/2012  Gen: well, no distress  U/S: 5.3 week IUGS with yolk sac, no fetal pole yet, no adnexal mass  A/P: 22 y.o. G2P1001 at [redacted]w[redacted]d with IUP Follow up for prenatal care, list of providers given Precautions rev'd

## 2012-06-18 NOTE — MAU Note (Signed)
Here for repeat u/s for viability, denies pain or bleeding at present. Does note intermittent cramping at times.

## 2012-06-23 ENCOUNTER — Other Ambulatory Visit: Payer: Self-pay | Admitting: Obstetrics & Gynecology

## 2012-06-23 DIAGNOSIS — O3680X0 Pregnancy with inconclusive fetal viability, not applicable or unspecified: Secondary | ICD-10-CM

## 2012-06-28 ENCOUNTER — Inpatient Hospital Stay (HOSPITAL_COMMUNITY): Payer: Medicaid Other

## 2012-06-28 ENCOUNTER — Encounter (HOSPITAL_COMMUNITY): Payer: Self-pay | Admitting: *Deleted

## 2012-06-28 ENCOUNTER — Inpatient Hospital Stay (HOSPITAL_COMMUNITY)
Admission: AD | Admit: 2012-06-28 | Discharge: 2012-06-28 | DRG: 781 | Disposition: A | Discharge status: Home / Self Care | Payer: Medicaid Other | Source: Ambulatory Visit | Attending: Obstetrics & Gynecology | Admitting: Obstetrics & Gynecology

## 2012-06-28 DIAGNOSIS — K5289 Other specified noninfective gastroenteritis and colitis: Secondary | ICD-10-CM

## 2012-06-28 DIAGNOSIS — K529 Noninfective gastroenteritis and colitis, unspecified: Secondary | ICD-10-CM

## 2012-06-28 DIAGNOSIS — O210 Mild hyperemesis gravidarum: Secondary | ICD-10-CM | POA: Diagnosis present

## 2012-06-28 DIAGNOSIS — O99891 Other specified diseases and conditions complicating pregnancy: Secondary | ICD-10-CM | POA: Diagnosis present

## 2012-06-28 DIAGNOSIS — O219 Vomiting of pregnancy, unspecified: Secondary | ICD-10-CM

## 2012-06-28 DIAGNOSIS — R109 Unspecified abdominal pain: Secondary | ICD-10-CM | POA: Diagnosis present

## 2012-06-28 LAB — URINALYSIS, ROUTINE W REFLEX MICROSCOPIC
Bilirubin Urine: NEGATIVE
Nitrite: NEGATIVE
Specific Gravity, Urine: 1.02 (ref 1.005–1.030)
Urobilinogen, UA: 1 mg/dL (ref 0.0–1.0)

## 2012-06-28 MED ORDER — LACTATED RINGERS IV BOLUS (SEPSIS)
1000.0000 mL | Freq: Once | INTRAVENOUS | Status: AC
  Administered 2012-06-28: 1000 mL via INTRAVENOUS

## 2012-06-28 MED ORDER — ONDANSETRON 8 MG PO TBDP: 8.0000 mg | ORAL_TABLET | Freq: Three times a day (TID) | ORAL | Status: DC | PRN

## 2012-06-28 MED ORDER — ONDANSETRON HCL 4 MG/2ML IJ SOLN
4.0000 mg | Freq: Once | INTRAMUSCULAR | Status: AC
  Administered 2012-06-28: 4 mg via INTRAVENOUS
  Filled 2012-06-28: qty 2

## 2012-06-28 NOTE — MAU Note (Signed)
Pt presents with complaints of abdominal cramping today that is very uncomfortable and she is not able to keep anything down. States she has had vomiting throughout the day. Denies any bleeding

## 2012-06-28 NOTE — MAU Provider Note (Signed)
History     CSN: [id number]  Arrival date and time: 06/28/12 1634   None     Chief Complaint  Patient presents with  . Emesis During Pregnancy  . Abdominal Cramping   HPI 22 y.o. G2P1001 at [redacted]w[redacted]d with n/v/d and cramping x 2 days. Family has had GI virus. No bleeding. IUP verified in MAU 10 days ago, IUGS, yolk sac seen on u/s, no fetal pole seen at that time.   Past Medical History  Diagnosis Date  . Depression     Past Surgical History  Procedure Laterality Date  . Appendectomy      History reviewed. No pertinent family history.  History  Substance Use Topics  . Smoking status: Current Every Day Smoker    Types: Cigarettes  . Smokeless tobacco: Never Used  . Alcohol Use: No    Allergies:  Allergies  Allergen Reactions  . Amoxicillin Anaphylaxis  . Penicillins Anaphylaxis  . Codeine Itching  . Morphine And Related Itching    Prescriptions prior to admission  Medication Sig Dispense Refill  . calcium carbonate (TUMS - DOSED IN MG ELEMENTAL CALCIUM) 500 MG chewable tablet Chew 1 tablet by mouth daily as needed for heartburn.      . metroNIDAZOLE (FLAGYL) 500 MG tablet Take 1 tablet (500 mg total) by mouth 2 (two) times daily.  14 tablet  0  . Prenatal Vit-Fe Fumarate-FA (PRENATAL MULTIVITAMIN) TABS Take 1 tablet by mouth daily at 12 noon.        Review of Systems  Constitutional: Negative.   Respiratory: Negative.   Cardiovascular: Negative.   Gastrointestinal: Positive for nausea, vomiting, abdominal pain and diarrhea. Negative for constipation.  Genitourinary: Negative for dysuria, urgency, frequency, hematuria and flank pain.       Negative for vaginal bleeding, vaginal discharge, dyspareunia  Musculoskeletal: Negative.   Neurological: Negative.   Psychiatric/Behavioral: Negative.    Physical Exam   Blood pressure 114/59, pulse 94, temperature 98 F (36.7 C), resp. rate 16, height 5' 4.5" (1.638 m), weight 151 lb (68.493 kg), last menstrual period  05/19/2012.  Physical Exam  Nursing note and vitals reviewed. Constitutional: She is oriented to person, place, and time. She appears well-developed and well-nourished. No distress.  Cardiovascular: Normal rate.   Respiratory: Effort normal.  Musculoskeletal: Normal range of motion.  Neurological: She is alert and oriented to person, place, and time.  Skin: Skin is warm and dry.  Psychiatric: She has a normal mood and affect.    MAU Course  Procedures  Results for orders placed during the hospital encounter of 06/28/12 (from the past 24 hour(s))  URINALYSIS, ROUTINE W REFLEX MICROSCOPIC     Status: Abnormal   Collection Time    06/28/12  4:35 PM      Result Value Range   Color, Urine YELLOW  YELLOW   APPearance HAZY (*) CLEAR   Specific Gravity, Urine 1.020  1.005 - 1.030   pH 8.0  5.0 - 8.0   Glucose, UA NEGATIVE  NEGATIVE mg/dL   Hgb urine dipstick NEGATIVE  NEGATIVE   Bilirubin Urine NEGATIVE  NEGATIVE   Ketones, ur NEGATIVE  NEGATIVE mg/dL   Protein, ur NEGATIVE  NEGATIVE mg/dL   Urobilinogen, UA 1.0  0.0 - 1.0 mg/dL   Nitrite NEGATIVE  NEGATIVE   Leukocytes, UA NEGATIVE  NEGATIVE   Feeling better after IV hydration and Zofran 4 mg IV  U/s shows 6 week size IUP with + fhr  Assessment and Plan  1. Gastroenteritis   2. Nausea and vomiting in pregnancy prior to [redacted] weeks gestation   Rx Zofran, BRAT diet, f/u for prenatal care or sooner if symptoms worsen    Medication List    TAKE these medications       calcium carbonate 500 MG chewable tablet  Commonly known as:  TUMS - dosed in mg elemental calcium  Chew 1 tablet by mouth daily as needed for heartburn.     metroNIDAZOLE 500 MG tablet  Commonly known as:  FLAGYL  Take 1 tablet (500 mg total) by mouth 2 (two) times daily.     ondansetron 8 MG disintegrating tablet  Commonly known as:  ZOFRAN ODT  Take 1 tablet (8 mg total) by mouth every 8 (eight) hours as needed for nausea.     prenatal multivitamin  Tabs  Take 1 tablet by mouth daily at 12 noon.            Follow-up Information   Schedule an appointment as soon as possible for a visit with [facility].   Contact information:   [address] [place] [address] [phone]        Asa Fath 06/28/2012, 5:01 PM

## 2012-06-30 NOTE — MAU Provider Note (Signed)

## 2012-07-01 ENCOUNTER — Encounter: Payer: Self-pay | Admitting: *Deleted

## 2012-07-01 ENCOUNTER — Telehealth: Payer: Self-pay | Admitting: Obstetrics and Gynecology

## 2012-07-01 NOTE — Telephone Encounter (Signed)
Left message. JSY 

## 2012-07-01 NOTE — Telephone Encounter (Signed)
Spoke with pt. Having some cramping, and light pink spotting when wiping. [redacted] weeks pregnant. Has New OB appt tomorrow at 11am. Advised cramping and spotting can be normal in early pregnancy. It could mean everything is just getting settled into place. Advised to not have sexual intercourse for 7 days from the last time wiping any color. Pt was very upset but I tried to reassure her. Advised to keep tomorrow's appt. Pt  voiced understanding. JSY

## 2012-07-02 ENCOUNTER — Ambulatory Visit (INDEPENDENT_AMBULATORY_CARE_PROVIDER_SITE_OTHER): Payer: Medicaid Other

## 2012-07-02 DIAGNOSIS — O3680X0 Pregnancy with inconclusive fetal viability, not applicable or unspecified: Secondary | ICD-10-CM

## 2012-07-02 DIAGNOSIS — O200 Threatened abortion: Secondary | ICD-10-CM

## 2012-07-02 NOTE — Progress Notes (Signed)
U/S-single IUP with +FCA noted, FHR=138BPM, CRL c/w prev u/s dates, cx long and closed, bilateral adnexa wnl, no free fluid noted, no areas of hemm noted

## 2012-07-18 ENCOUNTER — Encounter (HOSPITAL_COMMUNITY): Payer: Self-pay | Admitting: *Deleted

## 2012-07-18 ENCOUNTER — Inpatient Hospital Stay (HOSPITAL_COMMUNITY)
Admission: AD | Admit: 2012-07-18 | Discharge: 2012-07-19 | Disposition: A | Discharge status: Home / Self Care | Payer: Medicaid Other | Source: Ambulatory Visit | Attending: Obstetrics and Gynecology | Admitting: Obstetrics and Gynecology

## 2012-07-18 DIAGNOSIS — O210 Mild hyperemesis gravidarum: Secondary | ICD-10-CM | POA: Insufficient documentation

## 2012-07-18 DIAGNOSIS — R1031 Right lower quadrant pain: Secondary | ICD-10-CM | POA: Insufficient documentation

## 2012-07-18 DIAGNOSIS — R109 Unspecified abdominal pain: Secondary | ICD-10-CM

## 2012-07-18 DIAGNOSIS — O209 Hemorrhage in early pregnancy, unspecified: Secondary | ICD-10-CM | POA: Insufficient documentation

## 2012-07-18 DIAGNOSIS — O219 Vomiting of pregnancy, unspecified: Secondary | ICD-10-CM

## 2012-07-18 LAB — CBC
MCH: 29.5 pg (ref 26.0–34.0)
MCHC: 35.9 g/dL (ref 30.0–36.0)
MCV: 82.3 fL (ref 78.0–100.0)
Platelets: 237 10*3/uL (ref 150–400)
RDW: 12.7 % (ref 11.5–15.5)

## 2012-07-18 LAB — URINALYSIS, ROUTINE W REFLEX MICROSCOPIC
Bilirubin Urine: NEGATIVE
Glucose, UA: NEGATIVE mg/dL
Hgb urine dipstick: NEGATIVE
Ketones, ur: NEGATIVE mg/dL
Protein, ur: NEGATIVE mg/dL

## 2012-07-18 LAB — URINE MICROSCOPIC-ADD ON

## 2012-07-18 MED ORDER — PROMETHAZINE HCL 25 MG/ML IJ SOLN
25.0000 mg | Freq: Once | INTRAMUSCULAR | Status: AC
  Administered 2012-07-18: 25 mg via INTRAMUSCULAR
  Filled 2012-07-18: qty 1

## 2012-07-18 MED ORDER — CYCLOBENZAPRINE HCL 10 MG PO TABS
10.0000 mg | ORAL_TABLET | Freq: Once | ORAL | Status: AC
  Administered 2012-07-18: 10 mg via ORAL
  Filled 2012-07-18: qty 1

## 2012-07-18 MED ORDER — ACETAMINOPHEN 500 MG PO TABS
1000.0000 mg | ORAL_TABLET | Freq: Once | ORAL | Status: AC
  Administered 2012-07-18: 1000 mg via ORAL
  Filled 2012-07-18: qty 2

## 2012-07-18 NOTE — MAU Provider Note (Signed)
History     CSN: [id number]  Arrival date and time: 07/18/12 2055   First Provider Initiated Contact with Patient 07/18/12 2259      Chief Complaint  Patient presents with  . Abdominal Pain   HPI Ms. [name] is a 22 y.o. G2P1001 at [redacted]w[redacted]d who presents to MAU today with complaint of RLQ pain. The patient states that the pain started this morning and has progressively gotten worse throughout the day. She rates it at 7/10 now. She states that it is a constant stabbing pain. It is not worse with ambulation or change or positions. The patient denies vaginal bleeding, abnormal discharge, LOF or fever. She has nausea and vomiting that has been consistent throughout the pregnancy. She was given Zofran, but doesn't feel that it helps her manage her symptoms. She denies constipation, but states occasional loose stools, although none today.   OB History   Grav Para Term Preterm Abortions TAB SAB Ect Mult Living   2 1 1       1       Past Medical History  Diagnosis Date  . Depression     Past Surgical History  Procedure Laterality Date  . Appendectomy      Family History  Problem Relation Age of Onset  . Lupus Other     grandmother  . Hypertension Other   . Diabetes Other   . Heart disease Other     CAD  . Thyroid disease Other     History  Substance Use Topics  . Smoking status: Current Every Day Smoker    Types: Cigarettes  . Smokeless tobacco: Never Used  . Alcohol Use: No    Allergies:  Allergies  Allergen Reactions  . Amoxicillin Anaphylaxis  . Penicillins Anaphylaxis  . Codeine Itching  . Morphine And Related Itching    No prescriptions prior to admission    Review of Systems  Constitutional: Negative for fever and malaise/fatigue.  Gastrointestinal: Positive for nausea, vomiting and abdominal pain. Negative for diarrhea and constipation.  Genitourinary: Negative for dysuria, urgency and frequency.       Neg - vaginal bleeding, discharge, LOF   Musculoskeletal: Negative for back pain.   Physical Exam   Blood pressure 124/73, pulse 75, temperature 98.3 F (36.8 C), temperature source Oral, resp. rate 18, height 5\' 5"  (1.651 m), weight 156 lb (70.761 kg), last menstrual period 05/19/2012, SpO2 100.00%.  Physical Exam  Constitutional: She is oriented to person, place, and time. She appears well-developed and well-nourished. No distress.  HENT:  Head: Normocephalic and atraumatic.  Cardiovascular: Normal rate, regular rhythm and normal heart sounds.   Respiratory: Effort normal and breath sounds normal. No respiratory distress.  GI: Soft. Bowel sounds are normal. She exhibits no distension and no mass. There is tenderness (moderate tenderness to palpation of the RLQ). There is no rebound and no guarding.  Neurological: She is alert and oriented to person, place, and time.  Skin: Skin is warm and dry. No erythema.  Psychiatric: She has a normal mood and affect.   Results for orders placed during the hospital encounter of 07/18/12 (from the past 24 hour(s))  URINALYSIS, ROUTINE W REFLEX MICROSCOPIC     Status: Abnormal   Collection Time    07/18/12  9:25 PM      Result Value Range   Color, Urine YELLOW  YELLOW   APPearance CLEAR  CLEAR   Specific Gravity, Urine 1.025  1.005 - 1.030   pH 6.0  5.0 - 8.0   Glucose, UA NEGATIVE  NEGATIVE mg/dL   Hgb urine dipstick NEGATIVE  NEGATIVE   Bilirubin Urine NEGATIVE  NEGATIVE   Ketones, ur NEGATIVE  NEGATIVE mg/dL   Protein, ur NEGATIVE  NEGATIVE mg/dL   Urobilinogen, UA 0.2  0.0 - 1.0 mg/dL   Nitrite NEGATIVE  NEGATIVE   Leukocytes, UA TRACE (*) NEGATIVE  URINE MICROSCOPIC-ADD ON     Status: Abnormal   Collection Time    07/18/12  9:25 PM      Result Value Range   Squamous Epithelial / LPF FEW (*) RARE   WBC, UA 0-2  <3 WBC/hpf   RBC / HPF 0-2  <3 RBC/hpf   Bacteria, UA FEW (*) RARE  CBC     Status: Abnormal   Collection Time    07/18/12 11:16 PM      Result Value Range    WBC 14.7 (*) 4.0 - 10.5 K/uL   RBC 4.47  3.87 - 5.11 MIL/uL   Hemoglobin 13.2  12.0 - 15.0 g/dL   HCT 40.9  81.1 - 91.4 %   MCV 82.3  78.0 - 100.0 fL   MCH 29.5  26.0 - 34.0 pg   MCHC 35.9  30.0 - 36.0 g/dL   RDW 78.2  95.6 - 21.3 %   Platelets 237  150 - 400 K/uL     MAU Course  Procedures None  MDM IM phenergan, Flexeril and tylenol today - Patient reports improvement in nausea, but no improvement in pain CBC today Discussed patient with Dr. Emelda Fear. He did beside US showing fetal cardiac activity and subchorionic hemorrhage. Discussed results with patient. She is reassured The patient requests additional pain medication and Rx for phenergan  Assessment and Plan  A: Nausea and vomiting in pregnancy prior to [redacted] weeks gestation Abdominal pain in pregnancy Small subchorionic hemorrhage  P: Discharge home Pelvic rest  Patient to keep scheduled follow-up with [facility] Rx for phenergan and tramadol sent to patient's pharmacy Patient may return to MAU as needed or if her condition were to change or worsen  Freddi Starr, PA-C  07/19/2012, 12:31 AM

## 2012-07-18 NOTE — MAU Note (Signed)
Pt report she has been cramping on rt side all day today. Pt states she is not bleeding but that she knows the difference between her uterus getting bigger and pain and this is pain.

## 2012-07-19 MED ORDER — TRAMADOL HCL 50 MG PO TABS: 50.0000 mg | ORAL_TABLET | Freq: Four times a day (QID) | ORAL | Status: DC | PRN

## 2012-07-19 MED ORDER — PROMETHAZINE HCL 25 MG PO TABS: 25.0000 mg | ORAL_TABLET | Freq: Four times a day (QID) | ORAL | Status: DC | PRN

## 2012-07-26 ENCOUNTER — Other Ambulatory Visit (HOSPITAL_COMMUNITY)
Admission: RE | Admit: 2012-07-26 | Discharge: 2012-07-26 | Disposition: A | Discharge status: Home / Self Care | Payer: Medicaid Other | Source: Ambulatory Visit | Attending: Obstetrics & Gynecology | Admitting: Obstetrics & Gynecology

## 2012-07-26 ENCOUNTER — Encounter: Payer: Self-pay | Admitting: Women's Health

## 2012-07-26 ENCOUNTER — Ambulatory Visit (INDEPENDENT_AMBULATORY_CARE_PROVIDER_SITE_OTHER): Payer: Medicaid Other | Admitting: Women's Health

## 2012-07-26 VITALS — BP 118/78 | Wt 154.5 lb

## 2012-07-26 DIAGNOSIS — F339 Major depressive disorder, recurrent, unspecified: Secondary | ICD-10-CM

## 2012-07-26 DIAGNOSIS — Z3481 Encounter for supervision of other normal pregnancy, first trimester: Secondary | ICD-10-CM

## 2012-07-26 DIAGNOSIS — O09299 Supervision of pregnancy with other poor reproductive or obstetric history, unspecified trimester: Secondary | ICD-10-CM | POA: Insufficient documentation

## 2012-07-26 DIAGNOSIS — Z331 Pregnant state, incidental: Secondary | ICD-10-CM

## 2012-07-26 DIAGNOSIS — Z3480 Encounter for supervision of other normal pregnancy, unspecified trimester: Secondary | ICD-10-CM | POA: Insufficient documentation

## 2012-07-26 DIAGNOSIS — Z01419 Encounter for gynecological examination (general) (routine) without abnormal findings: Secondary | ICD-10-CM | POA: Insufficient documentation

## 2012-07-26 DIAGNOSIS — F909 Attention-deficit hyperactivity disorder, unspecified type: Secondary | ICD-10-CM

## 2012-07-26 DIAGNOSIS — O209 Hemorrhage in early pregnancy, unspecified: Secondary | ICD-10-CM

## 2012-07-26 DIAGNOSIS — O09899 Supervision of other high risk pregnancies, unspecified trimester: Secondary | ICD-10-CM

## 2012-07-26 DIAGNOSIS — O09291 Supervision of pregnancy with other poor reproductive or obstetric history, first trimester: Secondary | ICD-10-CM

## 2012-07-26 DIAGNOSIS — O200 Threatened abortion: Secondary | ICD-10-CM

## 2012-07-26 DIAGNOSIS — Z1389 Encounter for screening for other disorder: Secondary | ICD-10-CM

## 2012-07-26 LAB — POCT URINALYSIS DIPSTICK
Blood, UA: NEGATIVE
Glucose, UA: NEGATIVE

## 2012-07-26 MED ORDER — ESCITALOPRAM OXALATE 10 MG PO TABS: 10.0000 mg | ORAL_TABLET | Freq: Every day | ORAL | Status: DC

## 2012-07-26 NOTE — Progress Notes (Signed)
Cramping some. Vaginal bleeding 2 days ago. New OB packet given. Consents signed.

## 2012-07-26 NOTE — Patient Instructions (Addendum)
Pregnancy - First Trimester  During sexual intercourse, millions of sperm go into the vagina. Only 1 sperm will penetrate and fertilize the female egg while it is in the Fallopian tube. One week later, the fertilized egg implants into the wall of the uterus. An embryo begins to develop into a baby. At 6 to 8 weeks, the eyes and face are formed and the heartbeat can be seen on ultrasound. At the end of 12 weeks (first trimester), all the baby's organs are formed. Now that you are pregnant, you will want to do everything you can to have a healthy baby. Two of the most important things are to get good prenatal care and follow your caregiver's instructions. Prenatal care is all the medical care you receive before the baby's birth. It is given to prevent, find, and treat problems during the pregnancy and childbirth.  PRENATAL EXAMS  · During prenatal visits, your weight, blood pressure, and urine are checked. This is done to make sure you are healthy and progressing normally during the pregnancy.  · A pregnant woman should gain 25 to 35 pounds during the pregnancy. However, if you are overweight or underweight, your caregiver will advise you regarding your weight.  · Your caregiver will ask and answer questions for you.  · Blood work, cervical cultures, other necessary tests, and a Pap test are done during your prenatal exams. These tests are done to check on your health and the probable health of your baby. Tests are strongly recommended and done for HIV with your permission. This is the virus that causes AIDS. These tests are done because medicines can be given to help prevent your baby from being born with this infection should you have been infected without knowing it. Blood work is also used to find out your blood type, previous infections, and follow your blood levels (hemoglobin).  · Low hemoglobin (anemia) is common during pregnancy. Iron and vitamins are given to help prevent this. Later in the pregnancy, blood  tests for diabetes will be done along with any other tests if any problems develop.  · You may need other tests to make sure you and the baby are doing well.  CHANGES DURING THE FIRST TRIMESTER   Your body goes through many changes during pregnancy. They vary from person to person. Talk to your caregiver about changes you notice and are concerned about. Changes can include:  · Your menstrual period stops.  · The egg and sperm carry the genes that determine what you look like. Genes from you and your partner are forming a baby. The female genes determine whether the baby is a boy or a girl.  · Your body increases in girth and you may feel bloated.  · Feeling sick to your stomach (nauseous) and throwing up (vomiting). If the vomiting is uncontrollable, call your caregiver.  · Your breasts will begin to enlarge and become tender.  · Your nipples may stick out more and become darker.  · The need to urinate more. Painful urination may mean you have a bladder infection.  · Tiring easily.  · Loss of appetite.  · Cravings for certain kinds of food.  · At first, you may gain or lose a couple of pounds.  · You may have changes in your emotions from day to day (excited to be pregnant or concerned something may go wrong with the pregnancy and baby).  · You may have more vivid and strange dreams.  HOME CARE INSTRUCTIONS   ·   It is very important to avoid all smoking, alcohol and non-prescribed drugs during your pregnancy. These affect the formation and growth of the baby. Avoid chemicals while pregnant to ensure the delivery of a healthy infant.  · Start your prenatal visits by the 12th week of pregnancy. They are usually scheduled monthly at first, then more often in the last 2 months before delivery. Keep your caregiver's appointments. Follow your caregiver's instructions regarding medicine use, blood and lab tests, exercise, and diet.  · During pregnancy, you are providing food for you and your baby. Eat regular, well-balanced  meals. Choose foods such as meat, fish, milk and other low fat dairy products, vegetables, fruits, and whole-grain breads and cereals. Your caregiver will tell you of the ideal weight gain.  · You can help morning sickness by keeping soda crackers at the bedside. Eat a couple before arising in the morning. You may want to use the crackers without salt on them.  · Eating 4 to 5 small meals rather than 3 large meals a day also may help the nausea and vomiting.  · Drinking liquids between meals instead of during meals also seems to help nausea and vomiting.  · A physical sexual relationship may be continued throughout pregnancy if there are no other problems. Problems may be early (premature) leaking of amniotic fluid from the membranes, vaginal bleeding, or belly (abdominal) pain.  · Exercise regularly if there are no restrictions. Check with your caregiver or physical therapist if you are unsure of the safety of some of your exercises. Greater weight gain will occur in the last 2 trimesters of pregnancy. Exercising will help:  · Control your weight.  · Keep you in shape.  · Prepare you for labor and delivery.  · Help you lose your pregnancy weight after you deliver your baby.  · Wear a good support or jogging bra for breast tenderness during pregnancy. This may help if worn during sleep too.  · Ask when prenatal classes are available. Begin classes when they are offered.  · Do not use hot tubs, steam rooms, or saunas.  · Wear your seat belt when driving. This protects you and your baby if you are in an accident.  · Avoid raw meat, uncooked cheese, cat litter boxes, and soil used by cats throughout the pregnancy. These carry germs that can cause birth defects in the baby.  · The first trimester is a good time to visit your dentist for your dental health. Getting your teeth cleaned is okay. Use a softer toothbrush and brush gently during pregnancy.  · Ask for help if you have financial, counseling, or nutritional needs  during pregnancy. Your caregiver will be able to offer counseling for these needs as well as refer you for other special needs.  · Do not take any medicines or herbs unless told by your caregiver.  · Inform your caregiver if there is any mental or physical domestic violence.  · Make a list of emergency phone numbers of family, friends, hospital, and police and fire departments.  · Write down your questions. Take them to your prenatal visit.  · Do not douche.  · Do not cross your legs.  · If you have to stand for long periods of time, rotate you feet or take small steps in a circle.  · You may have more vaginal secretions that may require a sanitary pad. Do not use tampons or scented sanitary pads.  MEDICINES AND DRUG USE IN PREGNANCY  ·   Take prenatal vitamins as directed. The vitamin should contain 1 milligram of folic acid. Keep all vitamins out of reach of children. Only a couple vitamins or tablets containing iron may be fatal to a baby or young child when ingested.  · Avoid use of all medicines, including herbs, over-the-counter medicines, not prescribed or suggested by your caregiver. Only take over-the-counter or prescription medicines for pain, discomfort, or fever as directed by your caregiver. Do not use aspirin, ibuprofen, or naproxen unless directed by your caregiver.  · Let your caregiver also know about herbs you may be using.  · Alcohol is related to a number of birth defects. This includes fetal alcohol syndrome. All alcohol, in any form, should be avoided completely. Smoking will cause low birth rate and premature babies.  · Street or illegal drugs are very harmful to the baby. They are absolutely forbidden. A baby born to an addicted mother will be addicted at birth. The baby will go through the same withdrawal an adult does.  · Let your caregiver know about any medicines that you have to take and for what reason you take them.  SEEK MEDICAL CARE IF:   You have any concerns or worries during your  pregnancy. It is better to call with your questions if you feel they cannot wait, rather than worry about them.  SEEK IMMEDIATE MEDICAL CARE IF:   · An unexplained oral temperature above 102° F (38.9° C) develops, or as your caregiver suggests.  · You have leaking of fluid from the vagina (birth canal). If leaking membranes are suspected, take your temperature and inform your caregiver of this when you call.  · There is vaginal spotting or bleeding. Notify your caregiver of the amount and how many pads are used.  · You develop a bad smelling vaginal discharge with a change in the color.  · You continue to feel sick to your stomach (nauseated) and have no relief from remedies suggested. You vomit blood or coffee ground-like materials.  · You lose more than 2 pounds of weight in 1 week.  · You gain more than 2 pounds of weight in 1 week and you notice swelling of your face, hands, feet, or legs.  · You gain 5 pounds or more in 1 week (even if you do not have swelling of your hands, face, legs, or feet).  · You get exposed to German measles and have never had them.  · You are exposed to fifth disease or chickenpox.  · You develop belly (abdominal) pain. Round ligament discomfort is a common non-cancerous (benign) cause of abdominal pain in pregnancy. Your caregiver still must evaluate this.  · You develop headache, fever, diarrhea, pain with urination, or shortness of breath.  · You fall or are in a car accident or have any kind of trauma.  · There is mental or physical violence in your home.  Document Released: 01/28/2001 Document Revised: 10/29/2011 Document Reviewed: 08/01/2008  [employer]® Patient Information ©2014 [employer].

## 2012-07-26 NOTE — Progress Notes (Signed)
  Subjective:    [name] is a 22 y.o. G72P1001 Caucasian female at [redacted]w[redacted]d by 6wk u/s, being seen today for her first obstetrical visit.  Her obstetrical history is significant for pre-eclampsia w/ IOL at 37wks and VAVD, and PPD.  Pregnancy history fully reviewed. Interested in waterbirth- r/b discussed. Thinking about BTL- r/b discussed.  Has had multiple visits to [facility] this pregnancy d/t cramping/spotting. States she went last week and had b/s u/s by attending who stated she had a subchorionic hemorrhage. Has had occasional cramping, w/ small amount light pink spotting 2 days ago while wiping, none since.  Has h/o depression and anxiety, as well as PPD, not currently on any medications, but feels like she needs to be d/t frequent anxiety/panic attacks.   Still having nausea, phenergan working well  Filed Vitals:   07/26/12 1429  BP: 118/78  Weight: 154 lb 8 oz (70.081 kg)    HISTORY: OB History   Grav Para Term Preterm Abortions TAB SAB Ect Mult Living   2 1 1       1      # Outc Date GA Lbr Len/2nd Wgt Sex Del Anes PTL Lv   1 TRM 5/08    M SVD EPI No Yes   2 CUR              Past Medical History  Diagnosis Date  . Depression    Past Surgical History  Procedure Laterality Date  . Gallbladder surgery     Family History  Problem Relation Age of Onset  . Lupus Other     grandmother  . Hypertension Other   . Diabetes Other   . Heart disease Other     CAD  . Thyroid disease Other      Exam    Pelvic Exam:    Perineum: Normal Perineum   Vulva: normal   Vagina:  normal mucosa, normal discharge, no palpable nodules- no bleeding noted   Uterus   normal size/contour for 10wks     Cervix: normal   Adnexa: Not palpable   Urinary: urethral meatus normal    System:     Skin: normal coloration and turgor, no rashes    Neurologic: oriented, normal mood   Extremities: normal strength, tone, and muscle mass   HEENT PERRLA   Mouth/Teeth mucous membranes moist    Cardiovascular: regular rate and rhythm   Respiratory:  appears well, vitals normal, no respiratory distress, acyanotic, normal RR   Abdomen: soft, non-tender   FHR via doppler 147 Thin prep pap smear obtained    Assessment:    Pregnancy: G2P1001 Patient Active Problem List   Diagnosis Date Noted  . Supervision of other normal pregnancy 07/26/2012    Priority: High  . DEPRESSION, MAJOR, RECURRENT 04/16/2006  . ATTENTION DEFICIT, W/HYPERACTIVITY 04/16/2006  . TINNITUS 04/16/2006      [redacted]w[redacted]d G2P1001  New OB visit H/o Pre-e w/ IOL at 37wks w/ VAVD H/o depression, anxiety, and PPD- no meds currently 1st trimester spotting/cramping w/ [facility] dx last week Prior smoker- quit w/ +PT  Plan:     Initial labs drawn Continue prenatal vitamins Problem list reviewed and updated Reviewed n/v relief measures and warning s/s to report Genetic Screening discussed Integrated Screen: requested Cystic fibrosis screening discussed requested Ultrasound discussed; fetal survey: requested Follow up in 2 weeks for 1st IT/NT and visit Rx Lexapro 10mg  daily #30 w/ 6RF  Marge Duncans 07/26/2012 2:51 PM

## 2012-07-27 LAB — DRUG SCREEN, URINE, NO CONFIRMATION
Amphetamine Screen, Ur: NEGATIVE
Benzodiazepines.: NEGATIVE
Cocaine Metabolites: NEGATIVE
Methadone: NEGATIVE
Phencyclidine (PCP): NEGATIVE

## 2012-07-27 LAB — GC/CHLAMYDIA PROBE AMP: GC Probe RNA: NEGATIVE

## 2012-07-27 LAB — CBC
HCT: 37.9 % (ref 36.0–46.0)
Hemoglobin: 13.5 g/dL (ref 12.0–15.0)
MCH: 29.6 pg (ref 26.0–34.0)
MCHC: 35.6 g/dL (ref 30.0–36.0)
MCV: 83.1 fL (ref 78.0–100.0)

## 2012-07-27 LAB — URINALYSIS
Bilirubin Urine: NEGATIVE
Glucose, UA: NEGATIVE mg/dL
Ketones, ur: NEGATIVE mg/dL
Specific Gravity, Urine: 1.015 (ref 1.005–1.030)

## 2012-07-27 LAB — ABO AND RH

## 2012-07-27 LAB — OXYCODONE SCREEN, UA, RFLX CONFIRM: Oxycodone Screen, Ur: NEGATIVE ng/mL

## 2012-07-27 LAB — HIV ANTIBODY (ROUTINE TESTING W REFLEX): HIV: NONREACTIVE

## 2012-07-27 NOTE — MAU Provider Note (Signed)
Attestation of Attending Supervision of Advanced Practitioner: Evaluation and management procedures were performed by the PA/NP/CNM/OB Fellow under my supervision/collaboration. Chart reviewed and agree with management and plan.  Nava Song V 07/27/2012 5:18 PM

## 2012-07-28 LAB — URINE CULTURE
Colony Count: NO GROWTH
Organism ID, Bacteria: NO GROWTH

## 2012-07-28 LAB — VARICELLA ZOSTER ANTIBODY, IGG: Varicella IgG: 291.5 Index — ABNORMAL HIGH (ref <135.00)

## 2012-07-28 LAB — CYSTIC FIBROSIS DIAGNOSTIC STUDY

## 2012-07-31 ENCOUNTER — Encounter: Payer: Self-pay | Admitting: Women's Health

## 2012-08-02 ENCOUNTER — Telehealth: Payer: Self-pay | Admitting: Women's Health

## 2012-08-04 ENCOUNTER — Other Ambulatory Visit: Payer: Self-pay | Admitting: Obstetrics & Gynecology

## 2012-08-04 DIAGNOSIS — Z36 Encounter for antenatal screening of mother: Secondary | ICD-10-CM

## 2012-08-10 ENCOUNTER — Other Ambulatory Visit: Payer: Self-pay | Admitting: Women's Health

## 2012-08-10 ENCOUNTER — Ambulatory Visit (INDEPENDENT_AMBULATORY_CARE_PROVIDER_SITE_OTHER): Payer: Medicaid Other

## 2012-08-10 ENCOUNTER — Ambulatory Visit (INDEPENDENT_AMBULATORY_CARE_PROVIDER_SITE_OTHER): Payer: Medicaid Other | Admitting: Women's Health

## 2012-08-10 ENCOUNTER — Encounter: Payer: Self-pay | Admitting: Women's Health

## 2012-08-10 VITALS — BP 102/62 | Wt 154.6 lb

## 2012-08-10 DIAGNOSIS — Z1389 Encounter for screening for other disorder: Secondary | ICD-10-CM

## 2012-08-10 DIAGNOSIS — Z36 Encounter for antenatal screening of mother: Secondary | ICD-10-CM

## 2012-08-10 DIAGNOSIS — O2341 Unspecified infection of urinary tract in pregnancy, first trimester: Secondary | ICD-10-CM

## 2012-08-10 DIAGNOSIS — Z331 Pregnant state, incidental: Secondary | ICD-10-CM

## 2012-08-10 DIAGNOSIS — O2390 Unspecified genitourinary tract infection in pregnancy, unspecified trimester: Secondary | ICD-10-CM

## 2012-08-10 LAB — URINALYSIS
Hgb urine dipstick: NEGATIVE
Ketones, ur: NEGATIVE mg/dL
Nitrite: NEGATIVE
Specific Gravity, Urine: 1.026 (ref 1.005–1.030)
Urobilinogen, UA: 1 mg/dL (ref 0.0–1.0)

## 2012-08-10 LAB — POCT URINALYSIS DIPSTICK
Leukocytes, UA: NEGATIVE
Nitrite, UA: NEGATIVE

## 2012-08-10 MED ORDER — NITROFURANTOIN MONOHYD MACRO 100 MG PO CAPS: 100.0000 mg | ORAL_CAPSULE | Freq: Two times a day (BID) | ORAL | Status: DC

## 2012-08-10 NOTE — Progress Notes (Signed)
U/S (12+6wks)- single IUP with +FCA noted, CRL c/w dates, NB present, NT= 1.28mm, cx long and closed, bilateral adnexa wnl

## 2012-08-10 NOTE — Progress Notes (Signed)
Cramping some. Vaginal bleeding 1 week ago, none since then.

## 2012-08-10 NOTE — Patient Instructions (Signed)
Migraine Headache A migraine headache is an intense, throbbing pain on one or both sides of your head. A migraine can last for 30 minutes to several hours. CAUSES  The exact cause of a migraine headache is not always known. However, a migraine may be caused when nerves in the brain become irritated and release chemicals that cause inflammation. This causes pain. SYMPTOMS  Pain on one or both sides of your head.  Pulsating or throbbing pain.  Severe pain that prevents daily activities.  Pain that is aggravated by any physical activity.  Nausea, vomiting, or both.  Dizziness.  Pain with exposure to bright lights, loud noises, or activity.  General sensitivity to bright lights, loud noises, or smells. Before you get a migraine, you may get warning signs that a migraine is coming (aura). An aura may include:  Seeing flashing lights.  Seeing bright spots, halos, or zig-zag lines.  Having tunnel vision or blurred vision.  Having feelings of numbness or tingling.  Having trouble talking.  Having muscle weakness. MIGRAINE TRIGGERS  Alcohol.  Smoking.  Stress.  Menstruation.  Aged cheeses.  Foods or drinks that contain nitrates, glutamate, aspartame, or tyramine.  Lack of sleep.  Chocolate.  Caffeine.  Hunger.  Physical exertion.  Fatigue.  Medicines used to treat chest pain (nitroglycerine), birth control pills, estrogen, and some blood pressure medicines. DIAGNOSIS  A migraine headache is often diagnosed based on:  Symptoms.  Physical examination.  A CT scan or MRI of your head. TREATMENT Medicines may be given for pain and nausea. Medicines can also be given to help prevent recurrent migraines.  HOME CARE INSTRUCTIONS  Only take over-the-counter or prescription medicines for pain or discomfort as directed by your caregiver. The use of long-term narcotics is not recommended.  Lie down in a dark, quiet room when you have a migraine.  Keep a journal  to find out what may trigger your migraine headaches. For example, write down:  What you eat and drink.  How much sleep you get.  Any change to your diet or medicines.  Limit alcohol consumption.  Quit smoking if you smoke.  Get 7 to 9 hours of sleep, or as recommended by your caregiver.  Limit stress.  Keep lights dim if bright lights bother you and make your migraines worse. SEEK IMMEDIATE MEDICAL CARE IF:   Your migraine becomes severe.  You have a fever.  You have a stiff neck.  You have vision loss.  You have muscular weakness or loss of muscle control.  You start losing your balance or have trouble walking.  You feel faint or pass out.  You have severe symptoms that are different from your first symptoms. MAKE SURE YOU:   Understand these instructions.  Will watch your condition.  Will get help right away if you are not doing well or get worse. Document Released: 02/03/2005 Document Revised: 04/28/2011 Document Reviewed: 01/24/2011 Exit[employer] Patient Information 2014 [employer].  

## 2012-08-10 NOTE — Progress Notes (Signed)
Had light pink spotting w/ wiping 1 wk ago, none since. Irregular cramping.  Reports increased urinary frequency, urgency, and dysuria x 2-3days, feels like getting UTI.  Rx macrobid 100mg  bid x 7d.  Bad migraine the other day, then felt one coming on a few days ago, took apap and went away. Reviewed warning s/s to report.  All questions answered. F/U in 4wks for 2nd IT and visit.

## 2012-08-12 LAB — URINE CULTURE: Organism ID, Bacteria: NO GROWTH

## 2012-09-08 ENCOUNTER — Encounter: Payer: Medicaid Other | Admitting: Obstetrics and Gynecology

## 2012-09-09 ENCOUNTER — Encounter: Payer: Medicaid Other | Admitting: Obstetrics & Gynecology

## 2012-09-21 ENCOUNTER — Ambulatory Visit (INDEPENDENT_AMBULATORY_CARE_PROVIDER_SITE_OTHER): Payer: Medicaid Other | Admitting: Obstetrics and Gynecology

## 2012-09-21 ENCOUNTER — Other Ambulatory Visit: Payer: Self-pay | Admitting: Obstetrics and Gynecology

## 2012-09-21 VITALS — BP 108/60 | Wt 162.2 lb

## 2012-09-21 DIAGNOSIS — Z3482 Encounter for supervision of other normal pregnancy, second trimester: Secondary | ICD-10-CM

## 2012-09-21 DIAGNOSIS — O99340 Other mental disorders complicating pregnancy, unspecified trimester: Secondary | ICD-10-CM

## 2012-09-21 DIAGNOSIS — Z331 Pregnant state, incidental: Secondary | ICD-10-CM

## 2012-09-21 DIAGNOSIS — Z1389 Encounter for screening for other disorder: Secondary | ICD-10-CM

## 2012-09-21 LAB — POCT URINALYSIS DIPSTICK
Blood, UA: NEGATIVE
Glucose, UA: NEGATIVE
Ketones, UA: NEGATIVE

## 2012-09-21 NOTE — Progress Notes (Signed)
C/o migraines

## 2012-09-21 NOTE — Progress Notes (Signed)
Discussed h/a . Tylenol is adeq control fo or  Now.  Tiny Toes-- FEMALE.

## 2012-09-25 LAB — MATERNAL SCREEN, INTEGRATED #2
Age risk Down Syndrome: 1:1100 {titer}
Calculated Gestational Age: 18.9
Inhibin A Dimeric: 77 pg/mL
Inhibin A MoM: 0.44
MSS Trisomy 18 Risk: <1:5000 {titer}
NT MoM: 0.97
PAPP-A MoM: 0.83
Rish for ONTD: <1:5000 {titer}

## 2012-09-29 ENCOUNTER — Other Ambulatory Visit: Payer: Self-pay | Admitting: Obstetrics & Gynecology

## 2012-09-29 DIAGNOSIS — O09299 Supervision of pregnancy with other poor reproductive or obstetric history, unspecified trimester: Secondary | ICD-10-CM

## 2012-09-29 DIAGNOSIS — Z1389 Encounter for screening for other disorder: Secondary | ICD-10-CM

## 2012-09-30 ENCOUNTER — Encounter: Payer: Self-pay | Admitting: Obstetrics and Gynecology

## 2012-09-30 NOTE — Telephone Encounter (Signed)
Done

## 2012-10-05 ENCOUNTER — Other Ambulatory Visit: Payer: Medicaid Other

## 2012-10-05 ENCOUNTER — Encounter: Payer: Medicaid Other | Admitting: Women's Health

## 2012-10-14 ENCOUNTER — Ambulatory Visit (INDEPENDENT_AMBULATORY_CARE_PROVIDER_SITE_OTHER): Payer: Medicaid Other | Admitting: Advanced Practice Midwife

## 2012-10-14 ENCOUNTER — Encounter: Payer: Self-pay | Admitting: Advanced Practice Midwife

## 2012-10-14 VITALS — BP 120/80 | Wt 166.2 lb

## 2012-10-14 DIAGNOSIS — Z331 Pregnant state, incidental: Secondary | ICD-10-CM

## 2012-10-14 DIAGNOSIS — O99340 Other mental disorders complicating pregnancy, unspecified trimester: Secondary | ICD-10-CM

## 2012-10-14 DIAGNOSIS — Z1389 Encounter for screening for other disorder: Secondary | ICD-10-CM

## 2012-10-14 LAB — POCT URINALYSIS DIPSTICK: Nitrite, UA: NEGATIVE

## 2012-10-14 MED ORDER — AZITHROMYCIN 250 MG PO TABS: 250.0000 mg | ORAL_TABLET | Freq: Every day | ORAL | Status: DC

## 2012-10-14 NOTE — Patient Instructions (Signed)
Nothing to eat or drink after midnight before your next appointment & plan to be here for 2 hours (for your sugar test).  

## 2012-10-14 NOTE — Progress Notes (Signed)
Missed appt for anatomy scan, and it was not rescheduled  Will reschedule asap.  Son has strep throat, now feels as if she is getting it (sore throat, cough).  Throat red, no exudate.   Zpack given. Routine questions about pregnancy answered.  F/U asap for anatomy scan and in 5 weeks for PN2.

## 2012-10-19 ENCOUNTER — Ambulatory Visit (INDEPENDENT_AMBULATORY_CARE_PROVIDER_SITE_OTHER): Payer: Medicaid Other

## 2012-10-19 DIAGNOSIS — Z1389 Encounter for screening for other disorder: Secondary | ICD-10-CM

## 2012-10-19 DIAGNOSIS — O09299 Supervision of pregnancy with other poor reproductive or obstetric history, unspecified trimester: Secondary | ICD-10-CM

## 2012-10-19 NOTE — Progress Notes (Signed)
U/S(22+6wks)-active fetus, meas c/w dates, fluid wnl, cx long and closed 3.2cm, bilateral adnexa wnl, no major abnl noted, female fetus "Eli", EFW 1 lb 3 oz

## 2012-10-28 ENCOUNTER — Telehealth: Payer: Self-pay | Admitting: Obstetrics & Gynecology

## 2012-10-28 NOTE — Telephone Encounter (Signed)
Pt c/o "menstrual like cramps", no vaginal bleeding, +FM. Pt encouraged to push fluids, rest, can take tylenol if no improvement or cramps intensify or vaginal bleeding pt to call office back or go to [facility]. Pt verbalized understanding.

## 2012-11-18 ENCOUNTER — Other Ambulatory Visit: Payer: Medicaid Other

## 2012-11-18 ENCOUNTER — Encounter: Payer: Self-pay | Admitting: Obstetrics & Gynecology

## 2012-11-18 ENCOUNTER — Ambulatory Visit (INDEPENDENT_AMBULATORY_CARE_PROVIDER_SITE_OTHER): Payer: Medicaid Other | Admitting: Obstetrics & Gynecology

## 2012-11-18 VITALS — BP 110/72 | Wt 179.0 lb

## 2012-11-18 DIAGNOSIS — O99340 Other mental disorders complicating pregnancy, unspecified trimester: Secondary | ICD-10-CM

## 2012-11-18 DIAGNOSIS — Z23 Encounter for immunization: Secondary | ICD-10-CM

## 2012-11-18 DIAGNOSIS — Z331 Pregnant state, incidental: Secondary | ICD-10-CM

## 2012-11-18 DIAGNOSIS — Z1389 Encounter for screening for other disorder: Secondary | ICD-10-CM

## 2012-11-18 DIAGNOSIS — Z3483 Encounter for supervision of other normal pregnancy, third trimester: Secondary | ICD-10-CM

## 2012-11-18 LAB — POCT URINALYSIS DIPSTICK
Ketones, UA: NEGATIVE
Nitrite, UA: NEGATIVE
Protein, UA: NEGATIVE

## 2012-11-18 LAB — CBC
Hemoglobin: 12.2 g/dL (ref 12.0–15.0)
MCH: 29 pg (ref 26.0–34.0)
MCHC: 34.4 g/dL (ref 30.0–36.0)
MCV: 84.3 fL (ref 78.0–100.0)

## 2012-11-18 MED ORDER — INFLUENZA VAC SPLIT QUAD 0.5 ML IM SUSP
0.5000 mL | Freq: Once | INTRAMUSCULAR | Status: AC
  Administered 2012-11-18: 0.5 mL via INTRAMUSCULAR

## 2012-11-18 NOTE — Progress Notes (Signed)
BP weight and urine results all reviewed and noted. Patient reports good fetal movement, denies any bleeding and no rupture of membranes symptoms or regular contractions. Patient is without complaints. All questions were answered.  

## 2012-11-19 ENCOUNTER — Inpatient Hospital Stay (HOSPITAL_COMMUNITY)
Admission: AD | Admit: 2012-11-19 | Discharge: 2012-11-19 | Disposition: A | Discharge status: Home / Self Care | Payer: Medicaid Other | Source: Ambulatory Visit | Attending: Obstetrics & Gynecology | Admitting: Obstetrics & Gynecology

## 2012-11-19 ENCOUNTER — Encounter (HOSPITAL_COMMUNITY): Payer: Self-pay | Admitting: *Deleted

## 2012-11-19 DIAGNOSIS — N949 Unspecified condition associated with female genital organs and menstrual cycle: Secondary | ICD-10-CM

## 2012-11-19 DIAGNOSIS — O4700 False labor before 37 completed weeks of gestation, unspecified trimester: Secondary | ICD-10-CM | POA: Insufficient documentation

## 2012-11-19 DIAGNOSIS — R102 Pelvic and perineal pain: Secondary | ICD-10-CM

## 2012-11-19 DIAGNOSIS — O99891 Other specified diseases and conditions complicating pregnancy: Secondary | ICD-10-CM | POA: Insufficient documentation

## 2012-11-19 DIAGNOSIS — R109 Unspecified abdominal pain: Secondary | ICD-10-CM | POA: Insufficient documentation

## 2012-11-19 LAB — URINALYSIS, ROUTINE W REFLEX MICROSCOPIC
Bilirubin Urine: NEGATIVE
Ketones, ur: NEGATIVE mg/dL
Nitrite: NEGATIVE
Protein, ur: NEGATIVE mg/dL
Urobilinogen, UA: 0.2 mg/dL (ref 0.0–1.0)

## 2012-11-19 LAB — RPR

## 2012-11-19 LAB — WET PREP, GENITAL
Clue Cells Wet Prep HPF POC: NONE SEEN
Trich, Wet Prep: NONE SEEN
Yeast Wet Prep HPF POC: NONE SEEN

## 2012-11-19 LAB — HSV 2 ANTIBODY, IGG: HSV 2 Glycoprotein G Ab, IgG: 8.31 IV — ABNORMAL HIGH

## 2012-11-19 LAB — HIV ANTIBODY (ROUTINE TESTING W REFLEX): HIV: NONREACTIVE

## 2012-11-19 LAB — ANTIBODY SCREEN: Antibody Screen: NEGATIVE

## 2012-11-19 NOTE — MAU Note (Signed)
Patient presents with complaint of cramping since yesterday and pressure today. States she is experiencing urgency with urination as well.

## 2012-11-19 NOTE — MAU Provider Note (Signed)
History     CSN: [id number]  Arrival date and time: 11/19/12 1213   First Provider Initiated Contact with Patient 11/19/12 1306      Chief Complaint  Patient presents with  . Abdominal Pain   Abdominal Pain Associated symptoms include vomiting. Pertinent negatives include no constipation, diarrhea, dysuria, fever, frequency, headaches, hematuria, myalgias or nausea.   [name] is a 22 y.o. G2P1001 at [redacted]w[redacted]d presents for evaluation to 36 hours of painful cramping in her vagina that started yesterday. Patient states that she did throw up last night at 3 AM but otherwise been in her usual state of health. Patient states that her baby is moving well, no loss of fluid, no vaginal bleeding, and no obvious contraction. Patient describes the pain 4/10 sharp pain in her vagina and has a sensation of pressure urge to urinate.   OB History   Grav Para Term Preterm Abortions TAB SAB Ect Mult Living   2 1 1       1       Past Medical History  Diagnosis Date  . Depression     Past Surgical History  Procedure Laterality Date  . Gallbladder surgery      Family History  Problem Relation Age of Onset  . Lupus Other     grandmother  . Hypertension Other   . Diabetes Other   . Heart disease Other     CAD  . Thyroid disease Other     History  Substance Use Topics  . Smoking status: Former Smoker    Types: Cigarettes  . Smokeless tobacco: Never Used  . Alcohol Use: No    Allergies:  Allergies  Allergen Reactions  . Amoxicillin Anaphylaxis  . Penicillins Anaphylaxis  . Codeine Itching  . Morphine And Related Itching    Prescriptions prior to admission  Medication Sig Dispense Refill  . acetaminophen (TYLENOL) 325 MG tablet Take 650 mg by mouth daily as needed for pain (hip).      . Pediatric Multiple Vit-C-FA (FLINSTONES GUMMIES OMEGA-3 DHA PO) Take 2 each by mouth daily.         Review of Systems  Constitutional: Negative for fever and chills.  Eyes:  Negative for blurred vision.  Respiratory: Negative for cough, sputum production and shortness of breath.   Cardiovascular: Negative for chest pain.  Gastrointestinal: Positive for vomiting and abdominal pain (Vaginal cramping). Negative for heartburn, nausea, diarrhea, constipation and blood in stool.  Genitourinary: Positive for urgency. Negative for dysuria, frequency, hematuria and flank pain.  Musculoskeletal: Negative for myalgias.  Neurological: Negative for dizziness, tingling and headaches.   Physical Exam   Blood pressure 111/63, pulse 141, temperature 98.3 F (36.8 C), temperature source Oral, resp. rate 16, height 5' 5.5" (1.664 m), weight 79.436 kg (175 lb 2 oz), last menstrual period 05/19/2012, SpO2 100.00%.  Physical Exam  Constitutional: She is oriented to person, place, and time. She appears well-developed and well-nourished. No distress.  HENT:  Head: Normocephalic and atraumatic.  Eyes: Conjunctivae and EOM are normal.  Neck: Normal range of motion. Neck supple.  Cardiovascular: Normal heart sounds and intact distal pulses.  Exam reveals no gallop and no friction rub.   No murmur heard. Tachycardic  Respiratory: Effort normal and breath sounds normal. No respiratory distress. She has no wheezes. She has no rales. She exhibits no tenderness.  GI: Soft. Bowel sounds are normal. She exhibits no distension (gravid) and no mass. There is no tenderness. There is no rebound  and no guarding.  Genitourinary: Vaginal discharge (Minimal white discharge) found.  Musculoskeletal: Normal range of motion.  Neurological: She is alert and oriented to person, place, and time.  Skin: She is not diaphoretic.  Psychiatric: She has a normal mood and affect. Her behavior is normal. Judgment and thought content normal.   FHT 130s Mod vari, mult accels, no decels Toco: minimal uterine irriatbility no ctx  Results for TAELOR, WAYMIRE (MRN [id number]) as of 11/19/2012 13:35  Ref.  Range 11/19/2012 12:32  Color, Urine Latest Range: YELLOW  YELLOW  APPearance Latest Range: CLEAR  CLOUDY (A)  Specific Gravity, Urine Latest Range: 1.005-1.030  1.020  pH Latest Range: 5.0-8.0  6.0  Glucose Latest Range: NEGATIVE mg/dL NEGATIVE  Bilirubin Urine Latest Range: NEGATIVE  NEGATIVE  Ketones, ur Latest Range: NEGATIVE mg/dL NEGATIVE  Protein Latest Range: NEGATIVE mg/dL NEGATIVE  Urobilinogen, UA Latest Range: 0.0-1.0 mg/dL 0.2  Nitrite Latest Range: NEGATIVE  NEGATIVE  Leukocytes, UA Latest Range: NEGATIVE  SMALL (A)  Hgb urine dipstick Latest Range: NEGATIVE  NEGATIVE  WBC, UA Latest Range: <3 WBC/hpf 0-2  Squamous Epithelial / LPF Latest Range: RARE  MANY (A)  Bacteria, UA Latest Range: RARE  RARE   Results for JACKLYNN, DEHAAS (MRN [id number]) as of 11/19/2012 15:46  Ref. Range 11/19/2012 12:32 11/19/2012 12:56 11/19/2012 13:10  Yeast Wet Prep HPF POC Latest Range: NONE SEEN    NONE SEEN  Trich, Wet Prep Latest Range: NONE SEEN    NONE SEEN  Clue Cells Wet Prep HPF POC Latest Range: NONE SEEN    NONE SEEN  WBC, Wet Prep HPF POC Latest Range: NONE SEEN    MODERATE (A)  Color, Urine Latest Range: YELLOW  YELLOW    APPearance Latest Range: CLEAR  CLOUDY (A)    Specific Gravity, Urine Latest Range: 1.005-1.030  1.020    pH Latest Range: 5.0-8.0  6.0    Glucose Latest Range: NEGATIVE mg/dL NEGATIVE    Bilirubin Urine Latest Range: NEGATIVE  NEGATIVE    Ketones, ur Latest Range: NEGATIVE mg/dL NEGATIVE    Protein Latest Range: NEGATIVE mg/dL NEGATIVE    Urobilinogen, UA Latest Range: 0.0-1.0 mg/dL 0.2    Nitrite Latest Range: NEGATIVE  NEGATIVE    Leukocytes, UA Latest Range: NEGATIVE  SMALL (A)    Hgb urine dipstick Latest Range: NEGATIVE  NEGATIVE    WBC, UA Latest Range: <3 WBC/hpf 0-2    Squamous Epithelial / LPF Latest Range: RARE  MANY (A)    Bacteria, UA Latest Range: RARE  RARE    Fetal Fibronectin Latest Range: NEGATIVE    NEGATIVE    EKG Sinus tach  MAU  Course  Procedures  MDM Will eval with wet prep, GC/C, FFN, and EKG. UA neg  Assessment and Plan  CODI FOLKERTS is a 22 y.o. G2P1001 at [redacted]w[redacted]d here for evaluation of vaginal cramping and pressure. Negative labs, likely braxton hix. Encouraged hydration. And follow up with OB. Sinus tach asymptomatic will defer to [facility] to consider thyroid eval.   Ajdin Macke RYAN 11/19/2012, 1:30 PM

## 2012-11-20 LAB — GC/CHLAMYDIA PROBE AMP: GC Probe RNA: NEGATIVE

## 2012-11-24 ENCOUNTER — Encounter: Payer: Medicaid Other | Admitting: Obstetrics & Gynecology

## 2012-11-25 ENCOUNTER — Encounter: Payer: Medicaid Other | Admitting: Obstetrics & Gynecology

## 2012-11-25 NOTE — MAU Provider Note (Signed)
Attestation of Attending Supervision of Obstetric Fellow: Evaluation and management procedures were performed by the Obstetric Fellow under my supervision and collaboration.  I have reviewed the Obstetric Fellow's note and chart, and I agree with the management and plan.  Kane Kusek, MD, FACOG Attending Obstetrician & Gynecologist Faculty Practice, [facility]   

## 2012-11-26 ENCOUNTER — Encounter: Payer: Medicaid Other | Admitting: Obstetrics & Gynecology

## 2012-12-09 ENCOUNTER — Encounter: Payer: Self-pay | Admitting: Advanced Practice Midwife

## 2012-12-09 ENCOUNTER — Ambulatory Visit (INDEPENDENT_AMBULATORY_CARE_PROVIDER_SITE_OTHER): Payer: Medicaid Other | Admitting: Advanced Practice Midwife

## 2012-12-09 VITALS — BP 120/60 | Wt 178.0 lb

## 2012-12-09 DIAGNOSIS — Z1389 Encounter for screening for other disorder: Secondary | ICD-10-CM

## 2012-12-09 DIAGNOSIS — O99340 Other mental disorders complicating pregnancy, unspecified trimester: Secondary | ICD-10-CM

## 2012-12-09 DIAGNOSIS — R000 Tachycardia, unspecified: Secondary | ICD-10-CM

## 2012-12-09 DIAGNOSIS — Z331 Pregnant state, incidental: Secondary | ICD-10-CM

## 2012-12-09 LAB — POCT URINALYSIS DIPSTICK
Glucose, UA: NEGATIVE
Ketones, UA: NEGATIVE
Protein, UA: NEGATIVE

## 2012-12-09 LAB — TSH: TSH: 1.752 u[IU]/mL (ref 0.350–4.500)

## 2012-12-09 NOTE — Progress Notes (Signed)
Had sinus tach 120-140 last week (incidental finding when went to [facility] with pressure--negative fFN, exam there).  HR today 106.  Will check TSH.  Vagal exercises taught.  Consider cardiology consult if persistent or extra symptoms.

## 2012-12-09 NOTE — Progress Notes (Signed)
Went [facility] 2 week ago . C/C HEART RACING. Heart Rate 86 for one minute.

## 2012-12-13 ENCOUNTER — Telehealth: Payer: Self-pay | Admitting: Adult Health

## 2012-12-13 NOTE — Telephone Encounter (Signed)
Pt informed of WNL TSH from 12/09/2012.

## 2012-12-20 ENCOUNTER — Encounter: Payer: Self-pay | Admitting: Obstetrics & Gynecology

## 2012-12-20 DIAGNOSIS — B009 Herpesviral infection, unspecified: Secondary | ICD-10-CM | POA: Insufficient documentation

## 2012-12-20 DIAGNOSIS — O98519 Other viral diseases complicating pregnancy, unspecified trimester: Secondary | ICD-10-CM

## 2012-12-22 ENCOUNTER — Telehealth: Payer: Self-pay

## 2012-12-22 NOTE — Telephone Encounter (Signed)
Pt c/o "tinge of pink in mucus discharge" and lower back and pelvic pain. Pt encouraged to push fluid, rest as much as possible, take tylenol. Pt to call our office if no improvement and to keep her appt for tomorrow morning at 8:45 am. Pt verbalized understanding.

## 2012-12-23 ENCOUNTER — Encounter: Payer: Medicaid Other | Admitting: Obstetrics & Gynecology

## 2012-12-23 ENCOUNTER — Ambulatory Visit (INDEPENDENT_AMBULATORY_CARE_PROVIDER_SITE_OTHER): Payer: Medicaid Other | Admitting: Obstetrics and Gynecology

## 2012-12-23 VITALS — BP 144/94 | Wt 182.4 lb

## 2012-12-23 DIAGNOSIS — O99340 Other mental disorders complicating pregnancy, unspecified trimester: Secondary | ICD-10-CM

## 2012-12-23 DIAGNOSIS — Z331 Pregnant state, incidental: Secondary | ICD-10-CM

## 2012-12-23 DIAGNOSIS — Z1389 Encounter for screening for other disorder: Secondary | ICD-10-CM

## 2012-12-23 LAB — POCT URINALYSIS DIPSTICK
Blood, UA: NEGATIVE
Glucose, UA: NEGATIVE
Nitrite, UA: NEGATIVE

## 2012-12-23 NOTE — Progress Notes (Signed)
Single episode of spotting on tissue yesterday, none today. No recent sex, or trauma.  Good FM, + pressures sx.  BC options discussed :[facility] OPtions reviewed.  Reflexes 1+ bp's being checked at home, pt will notify if any continued elevations. PIH sx reviewed.

## 2012-12-25 ENCOUNTER — Encounter (HOSPITAL_COMMUNITY): Payer: Self-pay | Admitting: *Deleted

## 2012-12-25 ENCOUNTER — Inpatient Hospital Stay (HOSPITAL_COMMUNITY)
Admission: AD | Admit: 2012-12-25 | Discharge: 2012-12-25 | Disposition: A | Discharge status: Home / Self Care | Payer: Medicaid Other | Source: Ambulatory Visit | Attending: Obstetrics & Gynecology | Admitting: Obstetrics & Gynecology

## 2012-12-25 DIAGNOSIS — O99891 Other specified diseases and conditions complicating pregnancy: Secondary | ICD-10-CM | POA: Insufficient documentation

## 2012-12-25 DIAGNOSIS — R1011 Right upper quadrant pain: Secondary | ICD-10-CM | POA: Insufficient documentation

## 2012-12-25 DIAGNOSIS — K219 Gastro-esophageal reflux disease without esophagitis: Secondary | ICD-10-CM

## 2012-12-25 DIAGNOSIS — R42 Dizziness and giddiness: Secondary | ICD-10-CM | POA: Insufficient documentation

## 2012-12-25 LAB — CBC WITH DIFFERENTIAL/PLATELET
Band Neutrophils: 0 % (ref 0–10)
Basophils Absolute: 0 10*3/uL (ref 0.0–0.1)
Basophils Relative: 0 % (ref 0–1)
Blasts: 0 %
Eosinophils Absolute: 0.2 10*3/uL (ref 0.0–0.7)
Eosinophils Relative: 1 % (ref 0–5)
HCT: 33.5 % — ABNORMAL LOW (ref 36.0–46.0)
Hemoglobin: 11.5 g/dL — ABNORMAL LOW (ref 12.0–15.0)
Lymphocytes Relative: 17 % (ref 12–46)
Lymphs Abs: 2.6 10*3/uL (ref 0.7–4.0)
MCH: 27.8 pg (ref 26.0–34.0)
MCHC: 34.3 g/dL (ref 30.0–36.0)
MCV: 81.1 fL (ref 78.0–100.0)
Metamyelocytes Relative: 0 %
Monocytes Absolute: 1.2 10*3/uL — ABNORMAL HIGH (ref 0.1–1.0)
Monocytes Relative: 8 % (ref 3–12)
Myelocytes: 0 %
Neutro Abs: 11.4 10*3/uL — ABNORMAL HIGH (ref 1.7–7.7)
Neutrophils Relative %: 74 % (ref 43–77)
Platelets: 265 10*3/uL (ref 150–400)
Promyelocytes Absolute: 0 %
RBC: 4.13 MIL/uL (ref 3.87–5.11)
RDW: 13.5 % (ref 11.5–15.5)
WBC: 15.4 10*3/uL — ABNORMAL HIGH (ref 4.0–10.5)
nRBC: 0 /100 WBC

## 2012-12-25 LAB — URINE MICROSCOPIC-ADD ON

## 2012-12-25 LAB — URINALYSIS, ROUTINE W REFLEX MICROSCOPIC
Bilirubin Urine: NEGATIVE
Hgb urine dipstick: NEGATIVE
Nitrite: NEGATIVE
Protein, ur: NEGATIVE mg/dL
Specific Gravity, Urine: 1.015 (ref 1.005–1.030)
Urobilinogen, UA: 1 mg/dL (ref 0.0–1.0)

## 2012-12-25 LAB — COMPREHENSIVE METABOLIC PANEL
AST: 9 U/L (ref 0–37)
Albumin: 2.8 g/dL — ABNORMAL LOW (ref 3.5–5.2)
Alkaline Phosphatase: 111 U/L (ref 39–117)
Chloride: 102 mEq/L (ref 96–112)
Glucose, Bld: 104 mg/dL — ABNORMAL HIGH (ref 70–99)
Potassium: 3.5 mEq/L (ref 3.5–5.1)
Sodium: 134 mEq/L — ABNORMAL LOW (ref 135–145)
Total Bilirubin: 0.2 mg/dL — ABNORMAL LOW (ref 0.3–1.2)

## 2012-12-25 MED ORDER — PANTOPRAZOLE SODIUM 20 MG PO TBEC: 20.0000 mg | DELAYED_RELEASE_TABLET | Freq: Every day | ORAL | Status: DC

## 2012-12-25 MED ORDER — GI COCKTAIL ~~LOC~~
30.0000 mL | Freq: Once | ORAL | Status: AC
  Administered 2012-12-25: 30 mL via ORAL
  Filled 2012-12-25: qty 30

## 2012-12-25 NOTE — MAU Provider Note (Signed)
History     CSN: [id number]  Arrival date and time: 12/25/12 1930   None     Chief Complaint  Patient presents with  . Dizziness   HPI This is a 22 y.o. female at [redacted]w[redacted]d who presents with c/o pain in right upper quadrant of abdomen. Also has had a few episodes of dizziness today which resolved on their own. Denies nausea, vomiting or fever. Has been followed at [facility] for intermittent hypertension and is concerned this may be the cause of her symptoms.   Has had gallbladder removed in the past.   RN Note: My B/P has been up last few days. Today I'm really dizzy and have pain under my R rib. My B/P was 138/84 at home.      OB History   Grav Para Term Preterm Abortions TAB SAB Ect Mult Living   2 1 1       1       Past Medical History  Diagnosis Date  . Depression     Past Surgical History  Procedure Laterality Date  . Gallbladder surgery      Family History  Problem Relation Age of Onset  . Lupus Other     grandmother  . Hypertension Other   . Diabetes Other   . Heart disease Other     CAD  . Thyroid disease Other     History  Substance Use Topics  . Smoking status: Former Smoker    Types: Cigarettes  . Smokeless tobacco: Never Used  . Alcohol Use: No    Allergies:  Allergies  Allergen Reactions  . Amoxicillin Anaphylaxis  . Penicillins Anaphylaxis  . Codeine Itching  . Morphine And Related Itching    Prescriptions prior to admission  Medication Sig Dispense Refill  . acetaminophen (TYLENOL) 325 MG tablet Take 650 mg by mouth daily as needed for pain (hip).      . Pediatric Multiple Vit-C-FA (FLINSTONES GUMMIES OMEGA-3 DHA PO) Take 2 each by mouth daily.         Review of Systems  Constitutional: Negative for fever, chills and malaise/fatigue.  Eyes: Negative for blurred vision and double vision.  Gastrointestinal: Positive for abdominal pain. Negative for nausea, vomiting, diarrhea and constipation.  Genitourinary: Negative for dysuria.   Musculoskeletal: Negative for myalgias.  Neurological: Positive for dizziness. Negative for sensory change, focal weakness, weakness and headaches.   Physical Exam   Blood pressure 106/74, pulse 96, temperature 97.7 F (36.5 C), resp. rate 20, height 5\' 4"  (1.626 m), weight 184 lb 12.8 oz (83.825 kg), last menstrual period 05/19/2012, SpO2 100.00%.  Filed Vitals:   12/25/12 2047 12/25/12 2114 12/25/12 2115 12/25/12 2117  BP:  122/68 120/63 106/74  Pulse: 89 90 102 96  Temp:      Resp:      Height:      Weight:      SpO2: 100%       Physical Exam  Constitutional: She is oriented to person, place, and time. She appears well-developed and well-nourished. No distress.  HENT:  Head: Normocephalic.  Neck: Normal range of motion.  Cardiovascular: Normal rate.   Respiratory: Effort normal.  GI: Soft. She exhibits no distension and no mass. There is tenderness (to RUQ of abdomen). There is no rebound and no guarding.  Genitourinary:  Fetal heart rate reassuring No contractions   Musculoskeletal: Normal range of motion.  Neurological: She is alert and oriented to person, place, and time.  Skin: Skin is  warm and dry.  Psychiatric: She has a normal mood and affect.    MAU Course  Procedures  MDM Results for orders placed during the hospital encounter of 12/25/12 (from the past 24 hour(s))  URINALYSIS, ROUTINE W REFLEX MICROSCOPIC     Status: Abnormal   Collection Time    12/25/12  7:45 PM      Result Value Range   Color, Urine YELLOW  YELLOW   APPearance CLEAR  CLEAR   Specific Gravity, Urine 1.015  1.005 - 1.030   pH 6.0  5.0 - 8.0   Glucose, UA NEGATIVE  NEGATIVE mg/dL   Hgb urine dipstick NEGATIVE  NEGATIVE   Bilirubin Urine NEGATIVE  NEGATIVE   Ketones, ur NEGATIVE  NEGATIVE mg/dL   Protein, ur NEGATIVE  NEGATIVE mg/dL   Urobilinogen, UA 1.0  0.0 - 1.0 mg/dL   Nitrite NEGATIVE  NEGATIVE   Leukocytes, UA SMALL (*) NEGATIVE  URINE MICROSCOPIC-ADD ON     Status:  Abnormal   Collection Time    12/25/12  7:45 PM      Result Value Range   Squamous Epithelial / LPF FEW (*) RARE   WBC, UA 3-6  <3 WBC/hpf   RBC / HPF 0-2  <3 RBC/hpf   Bacteria, UA RARE  RARE  CBC WITH DIFFERENTIAL     Status: Abnormal   Collection Time    12/25/12  9:33 PM      Result Value Range   WBC 15.4 (*) 4.0 - 10.5 K/uL   RBC 4.13  3.87 - 5.11 MIL/uL   Hemoglobin 11.5 (*) 12.0 - 15.0 g/dL   HCT 16.1 (*) 09.6 - 04.5 %   MCV 81.1  78.0 - 100.0 fL   MCH 27.8  26.0 - 34.0 pg   MCHC 34.3  30.0 - 36.0 g/dL   RDW 40.9  81.1 - 91.4 %   Platelets 265  150 - 400 K/uL   Neutrophils Relative % 74  43 - 77 %   Lymphocytes Relative 17  12 - 46 %   Monocytes Relative 8  3 - 12 %   Eosinophils Relative 1  0 - 5 %   Basophils Relative 0  0 - 1 %   Band Neutrophils 0  0 - 10 %   Metamyelocytes Relative 0     Myelocytes 0     Promyelocytes Absolute 0     Blasts 0     nRBC 0  0 /100 WBC   Neutro Abs 11.4 (*) 1.7 - 7.7 K/uL   Lymphs Abs 2.6  0.7 - 4.0 K/uL   Monocytes Absolute 1.2 (*) 0.1 - 1.0 K/uL   Eosinophils Absolute 0.2  0.0 - 0.7 K/uL   Basophils Absolute 0.0  0.0 - 0.1 K/uL  COMPREHENSIVE METABOLIC PANEL     Status: Abnormal   Collection Time    12/25/12  9:33 PM      Result Value Range   Sodium 134 (*) 135 - 145 mEq/L   Potassium 3.5  3.5 - 5.1 mEq/L   Chloride 102  96 - 112 mEq/L   CO2 20  19 - 32 mEq/L   Glucose, Bld 104 (*) 70 - 99 mg/dL   BUN 7  6 - 23 mg/dL   Creatinine, Ser 7.82 (*) 0.50 - 1.10 mg/dL   Calcium 8.8  8.4 - 95.6 mg/dL   Total Protein 6.3  6.0 - 8.3 g/dL   Albumin 2.8 (*) 3.5 - 5.2 g/dL  AST 9  0 - 37 U/L   ALT 6  0 - 35 U/L   Alkaline Phosphatase 111  39 - 117 U/L   Total Bilirubin 0.2 (*) 0.3 - 1.2 mg/dL   GFR calc non Af Amer >90  >90 mL/min   GFR calc Af Amer >90  >90 mL/min  AMYLASE     Status: None   Collection Time    12/25/12  9:33 PM      Result Value Range   Amylase 35  0 - 105 U/L  LIPASE, BLOOD     Status: None   Collection  Time    12/25/12  9:33 PM      Result Value Range   Lipase 27  11 - 59 U/L   Gave her a dose of GI cocktail with excellent relief.  Assessment and Plan  A:  SIUP at [redacted]w[redacted]d       Probable GERD  P:  Discharge home      PIH precautions.       Rx Protonix      Follow up at [facility]  [facility] 12/25/2012, 11:05 PM

## 2012-12-25 NOTE — MAU Note (Signed)
My B/P has been up last few days. Today I'm really dizzy and have pain under my R rib. My B/P was 138/84 at home.

## 2013-01-06 ENCOUNTER — Ambulatory Visit (INDEPENDENT_AMBULATORY_CARE_PROVIDER_SITE_OTHER): Payer: Medicaid Other | Admitting: Advanced Practice Midwife

## 2013-01-06 ENCOUNTER — Encounter: Payer: Self-pay | Admitting: Advanced Practice Midwife

## 2013-01-06 VITALS — BP 118/80 | Wt 186.0 lb

## 2013-01-06 DIAGNOSIS — B009 Herpesviral infection, unspecified: Secondary | ICD-10-CM

## 2013-01-06 DIAGNOSIS — Z1389 Encounter for screening for other disorder: Secondary | ICD-10-CM

## 2013-01-06 DIAGNOSIS — Z331 Pregnant state, incidental: Secondary | ICD-10-CM

## 2013-01-06 DIAGNOSIS — O98519 Other viral diseases complicating pregnancy, unspecified trimester: Secondary | ICD-10-CM

## 2013-01-06 DIAGNOSIS — Z3483 Encounter for supervision of other normal pregnancy, third trimester: Secondary | ICD-10-CM

## 2013-01-06 DIAGNOSIS — O99340 Other mental disorders complicating pregnancy, unspecified trimester: Secondary | ICD-10-CM

## 2013-01-06 DIAGNOSIS — O99019 Anemia complicating pregnancy, unspecified trimester: Secondary | ICD-10-CM

## 2013-01-06 LAB — POCT URINALYSIS DIPSTICK
Blood, UA: NEGATIVE
Nitrite, UA: NEGATIVE

## 2013-01-06 MED ORDER — ACYCLOVIR 400 MG PO TABS: 400.0000 mg | ORAL_TABLET | Freq: Three times a day (TID) | ORAL | Status: DC

## 2013-01-06 MED ORDER — BUTALBITAL-APAP-CAFFEINE 50-325-40 MG PO TABS: 1.0000 | ORAL_TABLET | Freq: Four times a day (QID) | ORAL | Status: DC | PRN

## 2013-01-06 NOTE — Progress Notes (Signed)
Tension headache for last three days unrelieved by Tylenol. Blood pressures have been normal at home. Rx for Fioricet sent to pharmacy. Was seen in MAU 11/8 for upper abdomen pain dx with GERD- given Rx for Prilosec which is helping a great deal. Denies vaginal bleeding and leaking of fluid. BP good today and at home per patient- PIH sx reviewed. 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 Discussed the importance of taking meds even though asymptomatic and has never had an outbreak. BTL papers signed. F/U in 2 weeks.

## 2013-01-19 ENCOUNTER — Telehealth: Payer: Self-pay | Admitting: Certified Registered Nurse Anesthetist

## 2013-01-19 NOTE — Telephone Encounter (Signed)
Pt states lost mucus plug today and having irregular contractions, +FM, no gush of fluids. Pt informed to continue to monitor contractions, 5-10 minutes apart, gush of fluids pt informed to go to [facility].. Pt verbalized understanding.

## 2013-01-20 ENCOUNTER — Encounter: Payer: Self-pay | Admitting: Advanced Practice Midwife

## 2013-01-20 ENCOUNTER — Ambulatory Visit (INDEPENDENT_AMBULATORY_CARE_PROVIDER_SITE_OTHER): Payer: Medicaid Other | Admitting: Advanced Practice Midwife

## 2013-01-20 ENCOUNTER — Encounter (HOSPITAL_COMMUNITY): Payer: Self-pay | Admitting: General Practice

## 2013-01-20 ENCOUNTER — Inpatient Hospital Stay (HOSPITAL_COMMUNITY)
Admission: AD | Admit: 2013-01-20 | Discharge: 2013-01-21 | Disposition: A | Discharge status: Home / Self Care | Payer: Medicaid Other | Source: Ambulatory Visit | Attending: Obstetrics and Gynecology | Admitting: Obstetrics and Gynecology

## 2013-01-20 VITALS — BP 120/68 | Wt 189.0 lb

## 2013-01-20 DIAGNOSIS — O4700 False labor before 37 completed weeks of gestation, unspecified trimester: Secondary | ICD-10-CM | POA: Insufficient documentation

## 2013-01-20 DIAGNOSIS — Z1389 Encounter for screening for other disorder: Secondary | ICD-10-CM

## 2013-01-20 DIAGNOSIS — O98519 Other viral diseases complicating pregnancy, unspecified trimester: Secondary | ICD-10-CM

## 2013-01-20 DIAGNOSIS — O99019 Anemia complicating pregnancy, unspecified trimester: Secondary | ICD-10-CM

## 2013-01-20 DIAGNOSIS — O99340 Other mental disorders complicating pregnancy, unspecified trimester: Secondary | ICD-10-CM

## 2013-01-20 DIAGNOSIS — O1213 Gestational proteinuria, third trimester: Secondary | ICD-10-CM

## 2013-01-20 DIAGNOSIS — Z331 Pregnant state, incidental: Secondary | ICD-10-CM

## 2013-01-20 DIAGNOSIS — M549 Dorsalgia, unspecified: Secondary | ICD-10-CM | POA: Insufficient documentation

## 2013-01-20 LAB — URINALYSIS, ROUTINE W REFLEX MICROSCOPIC
Bilirubin Urine: NEGATIVE
Ketones, ur: 15 mg/dL — AB
Protein, ur: 30 mg/dL — AB
Specific Gravity, Urine: >1.03 — ABNORMAL HIGH (ref 1.005–1.030)
Urobilinogen, UA: 0.2 mg/dL (ref 0.0–1.0)

## 2013-01-20 LAB — POCT URINALYSIS DIPSTICK
Blood, UA: NEGATIVE
Glucose, UA: NEGATIVE
Leukocytes, UA: NEGATIVE

## 2013-01-20 LAB — URINE MICROSCOPIC-ADD ON

## 2013-01-20 MED ORDER — ONDANSETRON 4 MG PO TBDP
4.0000 mg | ORAL_TABLET | Freq: Four times a day (QID) | ORAL | Status: DC | PRN
  Administered 2013-01-20: 4 mg via ORAL
  Filled 2013-01-20: qty 1

## 2013-01-20 MED ORDER — OXYCODONE HCL 5 MG PO TABS
10.0000 mg | ORAL_TABLET | Freq: Once | ORAL | Status: AC
  Administered 2013-01-20: 10 mg via ORAL
  Filled 2013-01-20: qty 2

## 2013-01-20 MED ORDER — ACETAMINOPHEN 500 MG PO TABS
1000.0000 mg | ORAL_TABLET | Freq: Once | ORAL | Status: AC
  Administered 2013-01-20: 1000 mg via ORAL
  Filled 2013-01-20: qty 2

## 2013-01-20 NOTE — Progress Notes (Signed)
Having some irregular contractions. Lost mucus plug. Denies vaginal bleeding and gushes of fluid.. S/s of labor reviewed. All questions answered. 100 protein in urine today- sent for protein/creatinine ratio. F/u in 1 week

## 2013-01-20 NOTE — MAU Note (Signed)
Pt presents to MAU with c/o back pain for 2 days but becoming worse over the past 3 hours. Pt is tense and extremely tearful. "states she can't do this"

## 2013-01-21 ENCOUNTER — Telehealth: Payer: Self-pay | Admitting: Advanced Practice Midwife

## 2013-01-21 DIAGNOSIS — O479 False labor, unspecified: Secondary | ICD-10-CM

## 2013-01-21 LAB — PROTEIN / CREATININE RATIO, URINE
Creatinine, Urine: 284.1 mg/dL
Total Protein, Urine: 140 mg/dL

## 2013-01-21 NOTE — MAU Provider Note (Signed)
Attestation of Attending Supervision of Advanced Practitioner (CNM/NP): Evaluation and management procedures were performed by the Advanced Practitioner under my supervision and collaboration.  I have reviewed the Advanced Practitioner's note and chart, and I agree with the management and plan.  Yarelin Reichardt 01/21/2013 8:01 AM   

## 2013-01-21 NOTE — Telephone Encounter (Signed)
Spoke with pt. Went to [facility] last pm with back labor pain. Was not progressing, so she was sent home. Still having contractions. Was given a dose of Oxycodone 10 mg at hospital. Pt is requesting something for pain. Spoke with Dr. Emelda Fear. He wrote a Rx for Oxycodone 5 mg #15 1-2 po q 6 hours prn pain no refills. Pt to send a family member to pick up Rx. JSY

## 2013-01-21 NOTE — MAU Provider Note (Signed)
None     Chief Complaint:  Back Pain and vomited twice, feeling nauseated  [name] is  22 y.o. G2P1001 at [redacted]w[redacted]d presents complaining of Back Pain .  She states irregular, every 5 minutes contractions are associated with none vaginal bleeding, intact membranes, along with active fetal movement. Pain is mainly in her back.  Obstetrical/Gynecological History: OB History   Grav Para Term Preterm Abortions TAB SAB Ect Mult Living   2 1 1       1      Past Medical History: Past Medical History  Diagnosis Date  . Depression     Past Surgical History: Past Surgical History  Procedure Laterality Date  . Gallbladder surgery      Family History: Family History  Problem Relation Age of Onset  . Lupus Other     grandmother  . Hypertension Other   . Diabetes Other   . Heart disease Other     CAD  . Thyroid disease Other     Social History: History  Substance Use Topics  . Smoking status: Former Smoker    Types: Cigarettes  . Smokeless tobacco: Never Used  . Alcohol Use: No    Allergies:  Allergies  Allergen Reactions  . Amoxicillin Anaphylaxis  . Penicillins Anaphylaxis  . Codeine Itching  . Morphine And Related Itching    Meds:  Prescriptions prior to admission  Medication Sig Dispense Refill  . acyclovir (ZOVIRAX) 400 MG tablet Take 1 tablet (400 mg total) by mouth 3 (three) times daily.  90 tablet  1  . butalbital-acetaminophen-caffeine (FIORICET) 50-325-40 MG per tablet Take 1-2 tablets by mouth every 6 (six) hours as needed for headache.  20 tablet  0  . pantoprazole (PROTONIX) 20 MG tablet Take 1 tablet (20 mg total) by mouth daily.  30 tablet  1  . Pediatric Multiple Vit-C-FA (FLINSTONES GUMMIES OMEGA-3 DHA PO) Take 2 each by mouth daily.         Review of Systems   Constitutional: Negative for fever and chills Eyes: Negative for visual disturbances Respiratory: Negative for shortness of breath, dyspnea Cardiovascular: Negative for chest  pain or palpitations  Gastrointestinal: Negative for vomiting, diarrhea and constipation Genitourinary: Negative for dysuria and urgency Musculoskeletal: Negative , myalgias  Neurological: Negative for dizziness and headaches        Physical Exam  Blood pressure 120/77, pulse 129, temperature 97.4 F (36.3 C), temperature source Oral, resp. rate 22, height 5\' 5"  (1.651 m), weight 85.73 kg (189 lb), last menstrual period 05/19/2012. GENERAL: Well-developed, well-nourished female in no acute distress.  LUNGS: Clear to auscultation bilaterally.  HEART: Regular rate and rhythm. ABDOMEN: Soft, nontender, nondistended, gravid.  EXTREMITIES: Nontender, no edema, 2+ distal pulses. DTR's 2+ CERVICAL EXAM: Dilatation 3cm   Effacement 50%   Station -2   Presentation: cephalic FHT:  Baseline rate 140 bpm   Variability moderate  Accelerations present   Decelerations none Contractions: Every 4-5 mins   Labs: Results for orders placed during the hospital encounter of 01/20/13 (from the past 24 hour(s))  URINALYSIS, ROUTINE W REFLEX MICROSCOPIC   Collection Time    01/20/13  9:45 PM      Result Value Range   Color, Urine YELLOW  YELLOW   APPearance HAZY (*) CLEAR   Specific Gravity, Urine >1.030 (*) 1.005 - 1.030   pH 6.5  5.0 - 8.0   Glucose, UA 250 (*) NEGATIVE mg/dL   Hgb urine dipstick SMALL (*) NEGATIVE  Bilirubin Urine NEGATIVE  NEGATIVE   Ketones, ur 15 (*) NEGATIVE mg/dL   Protein, ur 30 (*) NEGATIVE mg/dL   Urobilinogen, UA 0.2  0.0 - 1.0 mg/dL   Nitrite NEGATIVE  NEGATIVE   Leukocytes, UA NEGATIVE  NEGATIVE  URINE MICROSCOPIC-ADD ON   Collection Time    01/20/13  9:45 PM      Result Value Range   Squamous Epithelial / LPF FEW (*) RARE   WBC, UA 3-6  <3 WBC/hpf   RBC / HPF 3-6  <3 RBC/hpf   Bacteria, UA RARE  RARE  POCT URINALYSIS DIPSTICK   Collection Time    01/20/13 11:51 AM      Result Value Range   Color, UA       Clarity, UA       Glucose, UA neg      Bilirubin, UA       Ketones, UA neg     Spec Grav, UA       Blood, UA neg     pH, UA       Protein, UA 100     Urobilinogen, UA       Nitrite, UA neg     Leukocytes, UA Negative     Pt was observed for over 4 hours without cervical change.  Oxycodone just made her "high" but still having contractions.  Offered procardia for comfort, but declined. Zofran ODT helped with nausea  Assessment: [name] is  22 y.o. G2P1001 at [redacted]w[redacted]d presents with false labor.  Plan: Come back when/if contractions increase in strength  [name],Lavern Crimi 12/5/201412:24 AM

## 2013-01-24 ENCOUNTER — Ambulatory Visit (INDEPENDENT_AMBULATORY_CARE_PROVIDER_SITE_OTHER): Payer: Medicaid Other | Admitting: Women's Health

## 2013-01-24 ENCOUNTER — Encounter: Payer: Self-pay | Admitting: Women's Health

## 2013-01-24 VITALS — BP 122/74 | Wt 193.0 lb

## 2013-01-24 DIAGNOSIS — Z3483 Encounter for supervision of other normal pregnancy, third trimester: Secondary | ICD-10-CM

## 2013-01-24 DIAGNOSIS — Z331 Pregnant state, incidental: Secondary | ICD-10-CM

## 2013-01-24 DIAGNOSIS — Z1389 Encounter for screening for other disorder: Secondary | ICD-10-CM

## 2013-01-24 DIAGNOSIS — O98519 Other viral diseases complicating pregnancy, unspecified trimester: Secondary | ICD-10-CM

## 2013-01-24 DIAGNOSIS — O99340 Other mental disorders complicating pregnancy, unspecified trimester: Secondary | ICD-10-CM

## 2013-01-24 DIAGNOSIS — O99019 Anemia complicating pregnancy, unspecified trimester: Secondary | ICD-10-CM

## 2013-01-24 LAB — OB RESULTS CONSOLE GC/CHLAMYDIA
Chlamydia: NEGATIVE
Gonorrhea: NEGATIVE

## 2013-01-24 LAB — POCT URINALYSIS DIPSTICK
Blood, UA: NEGATIVE
Glucose, UA: NEGATIVE
Ketones, UA: NEGATIVE
Leukocytes, UA: NEGATIVE
Nitrite, UA: NEGATIVE

## 2013-01-24 NOTE — Progress Notes (Signed)
Work-in for r/o labor. Went to [facility] thurs, was 3/50/-2, didn't make any change, was given percocet rx for back pain and d/c'd. Pt states uc's still in back, app q , percocet not really helping- just makes her feel loopy, wanted to be checked in office instead of driving all way to [facility] to be sent home.  Reports good fm. Denies lof, vb, urinary frequency, urgency, hesitancy, or dysuria. SVE: 4/50/-2, posterior, soft, vtx. Did not complain of any uc's during our app visit.  Reviewed labor s/s, when to go to hospital, can always call us to see if we can see her before driving to [place], better to go if she feels like she is in labor-even if she ends up being d/c'd, fkc. Recommended hands and knees position x 15-65mins few times throughout day to attempt to rotate baby to OA position in case OP.  All questions answered. F/U as scheduled for visit.  GBS, gc/ch today.

## 2013-01-24 NOTE — Patient Instructions (Signed)
Call the office or go to [facility] if:  You begin to have strong, frequent contractions  Your water breaks.  Sometimes it is a big gush of fluid, sometimes it is just a trickle that keeps getting your panties wet or running down your legs  You have vaginal bleeding.  It is normal to have a small amount of spotting if your cervix was checked.   You don't feel your baby moving like normal.  If you don't, get you something to eat and drink and lay down and focus on feeling your baby move.  You should feel at least 10 movements in 2 hours.  If you don't, you should call the office or go to [facility].   Braxton Hicks Contractions Pregnancy is commonly associated with contractions of the uterus throughout the pregnancy. Towards the end of pregnancy (32 to 34 weeks), these contractions (Braxton Hicks) can develop more often and may become more forceful. This is not true labor because these contractions do not result in opening (dilatation) and thinning of the cervix. They are sometimes difficult to tell apart from true labor because these contractions can be forceful and people have different pain tolerances. You should not feel embarrassed if you go to the hospital with false labor. Sometimes, the only way to tell if you are in true labor is for your caregiver to follow the changes in the cervix. How to tell the difference between true and false labor:  False labor.  The contractions of false labor are usually shorter, irregular and not as hard as those of true labor.  They are often felt in the front of the lower abdomen and in the groin.  They may leave with walking around or changing positions while lying down.  They get weaker and are shorter lasting as time goes on.  These contractions are usually irregular.  They do not usually become progressively stronger, regular and closer together as with true labor.  True labor.  Contractions in true labor last 30 to 70 seconds,  become very regular, usually become more intense, and increase in frequency.  They do not go away with walking.  The discomfort is usually felt in the top of the uterus and spreads to the lower abdomen and low back.  True labor can be determined by your caregiver with an exam. This will show that the cervix is dilating and getting thinner. If there are no prenatal problems or other health problems associated with the pregnancy, it is completely safe to be sent home with false labor and await the onset of true labor. HOME CARE INSTRUCTIONS   Keep up with your usual exercises and instructions.  Take medications as directed.  Keep your regular prenatal appointment.  Eat and drink lightly if you think you are going into labor.  If BH contractions are making you uncomfortable:  Change your activity position from lying down or resting to walking/walking to resting.  Sit and rest in a tub of warm water.  Drink 2 to 3 glasses of water. Dehydration may cause B-H contractions.  Do slow and deep breathing several times an hour. SEEK IMMEDIATE MEDICAL CARE IF:   Your contractions continue to become stronger, more regular, and closer together.  You have a gushing, burst or leaking of fluid from the vagina.  An oral temperature above 102 F (38.9 C) develops.  You have passage of blood-tinged mucus.  You develop vaginal bleeding.  You develop continuous belly (abdominal) pain.  You have   low back pain that you never had before.  You feel the baby's head pushing down causing pelvic pressure.  The baby is not moving as much as it used to. Document Released: 02/03/2005 Document Revised: 04/28/2011 Document Reviewed: 07/28/2008 [employer] Patient Information 2014 [employer].  

## 2013-01-24 NOTE — Progress Notes (Signed)
Pt states that she is having terrible back pain, questionable contractions. Pt denies any gush of fluid or bleeding.

## 2013-01-25 ENCOUNTER — Encounter (HOSPITAL_COMMUNITY): Payer: Self-pay | Admitting: *Deleted

## 2013-01-25 ENCOUNTER — Inpatient Hospital Stay (HOSPITAL_COMMUNITY)
Admission: AD | Admit: 2013-01-25 | Discharge: 2013-01-25 | Disposition: A | Discharge status: Home / Self Care | Payer: Medicaid Other | Source: Ambulatory Visit | Attending: Family Medicine | Admitting: Family Medicine

## 2013-01-25 DIAGNOSIS — M545 Low back pain, unspecified: Secondary | ICD-10-CM | POA: Insufficient documentation

## 2013-01-25 DIAGNOSIS — O4700 False labor before 37 completed weeks of gestation, unspecified trimester: Secondary | ICD-10-CM | POA: Insufficient documentation

## 2013-01-25 DIAGNOSIS — Z87891 Personal history of nicotine dependence: Secondary | ICD-10-CM | POA: Insufficient documentation

## 2013-01-25 LAB — URINALYSIS, ROUTINE W REFLEX MICROSCOPIC
Glucose, UA: NEGATIVE mg/dL
Hgb urine dipstick: NEGATIVE
Leukocytes, UA: NEGATIVE
Nitrite: NEGATIVE
Specific Gravity, Urine: 1.02 (ref 1.005–1.030)
Urobilinogen, UA: 0.2 mg/dL (ref 0.0–1.0)
pH: 5.5 (ref 5.0–8.0)

## 2013-01-25 LAB — GC/CHLAMYDIA PROBE AMP
CT Probe RNA: NEGATIVE
GC Probe RNA: NEGATIVE

## 2013-01-25 NOTE — MAU Note (Signed)
Pt up to BR and u/a sent

## 2013-01-25 NOTE — Progress Notes (Signed)
Dr Corrin Parker notified of pt's admission and status. Will see pt

## 2013-01-25 NOTE — MAU Provider Note (Signed)
Chief Complaint:  Labor Eval   First Provider Initiated Contact with Patient 01/25/13 1921      HPI: [name] is a 22 y.o. G2P1001 at [redacted]w[redacted]d who presents to maternity admissions reporting increasingly severe contraction pain felt only in low back about q 5 min lasting about a minute. Last VE at [facility] yesterday: 4/80/-2. Prior exam 4 days before 3/50/-2. Had slight show after that exam. No  leakage of fluid. Good fetal movement.  Lives in [place].  Pregnancy Course: Significant for headaches, depression, prior IOL for preE.   Past Medical History: Past Medical History  Diagnosis Date  . Depression   . Pregnancy induced hypertension     Past obstetric history: OB History  Gravida Para Term Preterm AB SAB TAB Ectopic Multiple Living  2 1 1       1     # Outcome Date GA Lbr Len/2nd Weight Sex Delivery Anes PTL Lv  2 CUR           1 TRM 07/11/06 [redacted]w[redacted]d  2.92 kg (6 lb 7 oz) M VAC EPI N Y     Comments: IOL pre-eclampsia      Past Surgical History: Past Surgical History  Procedure Laterality Date  . Gallbladder surgery       Family History: Family History  Problem Relation Age of Onset  . Lupus Other     grandmother  . Hypertension Other   . Diabetes Other   . Heart disease Other     CAD  . Thyroid disease Other     Social History: History  Substance Use Topics  . Smoking status: Former Smoker    Types: Cigarettes  . Smokeless tobacco: Never Used  . Alcohol Use: No    Allergies:  Allergies  Allergen Reactions  . Amoxicillin Anaphylaxis  . Penicillins Anaphylaxis  . Codeine Itching  . Morphine And Related Itching    Meds:  Prescriptions prior to admission  Medication Sig Dispense Refill  . acyclovir (ZOVIRAX) 400 MG tablet Take 1 tablet (400 mg total) by mouth 3 (three) times daily.  90 tablet  1  . butalbital-acetaminophen-caffeine (FIORICET) 50-325-40 MG per tablet Take 1-2 tablets by mouth every 6 (six) hours as needed for headache.  20 tablet  0   . oxyCODONE-acetaminophen (PERCOCET/ROXICET) 5-325 MG per tablet Take 1 tablet by mouth every 4 (four) hours as needed for severe pain.      . pantoprazole (PROTONIX) 20 MG tablet Take 1 tablet (20 mg total) by mouth daily.  30 tablet  1  . Pediatric Multiple Vit-C-FA (FLINSTONES GUMMIES OMEGA-3 DHA PO) Take 2 each by mouth daily.         ROS: Pertinent findings in history of present illness.  Physical Exam  Blood pressure 128/77, pulse 120, temperature 97.9 F (36.6 C), temperature source Oral, resp. rate 20, height 5\' 5"  (1.651 m), weight 87.544 kg (193 lb), last menstrual period 05/19/2012, SpO2 100.00%. GENERAL: Well-developed, well-nourished female in no acute distress.  HEENT: normocephalic HEART: normal rate RESP: normal effort ABDOMEN: Soft, non-tender, gravid appropriate for gestational age BACK: neg CVAT EXTREMITIES: Nontender, no edema NEURO: alert and oriented SPECULUM EXAM: NEFG, physiologic discharge, no blood, cervix clean Dilation: 3.5 Effacement (%): 40 Cervical Position: Posterior Station: -3 Presentation: Vertex Exam by:: D. Poe CNM  FHT:  Baseline 125 , moderate variability, accelerations present, no decelerations Contractions: slight UI   Labs: Results for orders placed during the hospital encounter of 01/25/13 (from the past 24  hour(s))  URINALYSIS, ROUTINE W REFLEX MICROSCOPIC     Status: None   Collection Time    01/25/13  7:00 PM      Result Value Range   Color, Urine YELLOW  YELLOW   APPearance CLEAR  CLEAR   Specific Gravity, Urine 1.020  1.005 - 1.030   pH 5.5  5.0 - 8.0   Glucose, UA NEGATIVE  NEGATIVE mg/dL   Hgb urine dipstick NEGATIVE  NEGATIVE   Bilirubin Urine NEGATIVE  NEGATIVE   Ketones, ur NEGATIVE  NEGATIVE mg/dL   Protein, ur NEGATIVE  NEGATIVE mg/dL   Urobilinogen, UA 0.2  0.0 - 1.0 mg/dL   Nitrite NEGATIVE  NEGATIVE   Leukocytes, UA NEGATIVE  NEGATIVE    Imaging:  No results found. MAU Course: Monitoring, ambulation and  observation for labor Care assuemed by Thressa Sheller CNM at 2000 No cervical change after observation, and ambulation.   Danae Orleans, CNM 01/25/2013 7:33 PM   Assessment/Plan: False labor at term Labor precautions Fetal Kick counts FU as schedule with the office Return to MAU as needed

## 2013-01-25 NOTE — MAU Note (Signed)
Patient states she was 4 4.5 cm in the office yesterday. Has bloody show.

## 2013-01-25 NOTE — MAU Note (Signed)
Patient states she has been having back pain for several days, getting worse with bloody show. States baby has not been moving as much as usual

## 2013-01-25 NOTE — MAU Note (Signed)
Pt up to walk.

## 2013-01-25 NOTE — MAU Note (Signed)
Pt back from walking, monitors applied.

## 2013-01-26 LAB — STREP B DNA PROBE: GBSP: NEGATIVE

## 2013-01-26 NOTE — MAU Provider Note (Signed)
Chart reviewed and agree with management and plan.  

## 2013-01-27 ENCOUNTER — Encounter: Payer: Self-pay | Admitting: Advanced Practice Midwife

## 2013-01-27 ENCOUNTER — Ambulatory Visit (INDEPENDENT_AMBULATORY_CARE_PROVIDER_SITE_OTHER): Payer: Medicaid Other | Admitting: Advanced Practice Midwife

## 2013-01-27 VITALS — BP 120/80 | Wt 193.0 lb

## 2013-01-27 DIAGNOSIS — O98519 Other viral diseases complicating pregnancy, unspecified trimester: Secondary | ICD-10-CM

## 2013-01-27 DIAGNOSIS — O99340 Other mental disorders complicating pregnancy, unspecified trimester: Secondary | ICD-10-CM

## 2013-01-27 DIAGNOSIS — O99019 Anemia complicating pregnancy, unspecified trimester: Secondary | ICD-10-CM

## 2013-01-27 DIAGNOSIS — Z331 Pregnant state, incidental: Secondary | ICD-10-CM

## 2013-01-27 DIAGNOSIS — Z1389 Encounter for screening for other disorder: Secondary | ICD-10-CM

## 2013-01-27 LAB — POCT URINALYSIS DIPSTICK
Blood, UA: NEGATIVE
Glucose, UA: NEGATIVE
Leukocytes, UA: NEGATIVE
Nitrite, UA: NEGATIVE
Protein, UA: NEGATIVE

## 2013-01-27 NOTE — Progress Notes (Signed)
Has been seen frequently for r/o labor.  Still contracting.  Labor precautions discussed.  Had a 0.49mg  protein 1 WEEK ago.  Has had several encounters since then with no protein and normal blood pressures.  Discussed with [facility].  Very reluctant to label preeclampsia based on the rest of the clinical picture.

## 2013-01-28 ENCOUNTER — Encounter: Payer: Self-pay | Admitting: Women's Health

## 2013-01-31 ENCOUNTER — Encounter: Payer: Medicaid Other | Admitting: Obstetrics & Gynecology

## 2013-02-03 ENCOUNTER — Encounter: Payer: Self-pay | Admitting: Advanced Practice Midwife

## 2013-02-03 ENCOUNTER — Ambulatory Visit (INDEPENDENT_AMBULATORY_CARE_PROVIDER_SITE_OTHER): Payer: Medicaid Other | Admitting: Advanced Practice Midwife

## 2013-02-03 VITALS — BP 124/80 | Wt 193.0 lb

## 2013-02-03 DIAGNOSIS — O99019 Anemia complicating pregnancy, unspecified trimester: Secondary | ICD-10-CM

## 2013-02-03 DIAGNOSIS — O98519 Other viral diseases complicating pregnancy, unspecified trimester: Secondary | ICD-10-CM

## 2013-02-03 DIAGNOSIS — Z1389 Encounter for screening for other disorder: Secondary | ICD-10-CM

## 2013-02-03 DIAGNOSIS — O99340 Other mental disorders complicating pregnancy, unspecified trimester: Secondary | ICD-10-CM

## 2013-02-03 DIAGNOSIS — Z331 Pregnant state, incidental: Secondary | ICD-10-CM

## 2013-02-03 LAB — POCT URINALYSIS DIPSTICK
Ketones, UA: NEGATIVE
Nitrite, UA: NEGATIVE
Protein, UA: NEGATIVE

## 2013-02-03 NOTE — Progress Notes (Signed)
No c/o at this time.  Routine questions about pregnancy answered.  F/U in 1 weeks for LROB.  

## 2013-02-05 ENCOUNTER — Encounter (HOSPITAL_COMMUNITY): Payer: Self-pay | Admitting: *Deleted

## 2013-02-05 ENCOUNTER — Inpatient Hospital Stay (HOSPITAL_COMMUNITY)
Admission: AD | Admit: 2013-02-05 | Discharge: 2013-02-05 | Disposition: A | Discharge status: Home / Self Care | Payer: Medicaid Other | Source: Ambulatory Visit | Attending: Obstetrics and Gynecology | Admitting: Obstetrics and Gynecology

## 2013-02-05 DIAGNOSIS — O479 False labor, unspecified: Secondary | ICD-10-CM | POA: Insufficient documentation

## 2013-02-05 DIAGNOSIS — B009 Herpesviral infection, unspecified: Secondary | ICD-10-CM

## 2013-02-05 DIAGNOSIS — Z3483 Encounter for supervision of other normal pregnancy, third trimester: Secondary | ICD-10-CM

## 2013-02-05 DIAGNOSIS — O09291 Supervision of pregnancy with other poor reproductive or obstetric history, first trimester: Secondary | ICD-10-CM

## 2013-02-05 NOTE — MAU Note (Signed)
contractions 

## 2013-02-07 ENCOUNTER — Encounter (HOSPITAL_COMMUNITY): Payer: Medicaid Other | Admitting: Anesthesiology

## 2013-02-07 ENCOUNTER — Inpatient Hospital Stay (HOSPITAL_COMMUNITY)
Admission: AD | Admit: 2013-02-07 | Discharge: 2013-02-09 | DRG: 766 | Disposition: A | Discharge status: Home / Self Care | Payer: Medicaid Other | Source: Ambulatory Visit | Attending: Obstetrics & Gynecology | Admitting: Obstetrics & Gynecology

## 2013-02-07 ENCOUNTER — Encounter (HOSPITAL_COMMUNITY): Payer: Self-pay

## 2013-02-07 ENCOUNTER — Inpatient Hospital Stay (HOSPITAL_COMMUNITY): Payer: Medicaid Other | Admitting: Anesthesiology

## 2013-02-07 ENCOUNTER — Encounter (HOSPITAL_COMMUNITY)
Admission: AD | Disposition: A | Discharge status: Home / Self Care | Payer: Self-pay | Source: Ambulatory Visit | Attending: Obstetrics & Gynecology

## 2013-02-07 DIAGNOSIS — O324XX0 Maternal care for high head at term, not applicable or unspecified: Secondary | ICD-10-CM

## 2013-02-07 DIAGNOSIS — O4290 Premature rupture of membranes, unspecified as to length of time between rupture and onset of labor, unspecified weeks of gestation: Secondary | ICD-10-CM

## 2013-02-07 DIAGNOSIS — Z98891 History of uterine scar from previous surgery: Secondary | ICD-10-CM

## 2013-02-07 DIAGNOSIS — IMO0001 Reserved for inherently not codable concepts without codable children: Secondary | ICD-10-CM

## 2013-02-07 LAB — CBC
Hemoglobin: 12.7 g/dL (ref 12.0–15.0)
MCH: 26.2 pg (ref 26.0–34.0)
Platelets: 221 10*3/uL (ref 150–400)
RBC: 4.84 MIL/uL (ref 3.87–5.11)

## 2013-02-07 LAB — RPR: RPR Ser Ql: NONREACTIVE

## 2013-02-07 SURGERY — Surgical Case
Anesthesia: Epidural | Site: Abdomen

## 2013-02-07 MED ORDER — SENNOSIDES-DOCUSATE SODIUM 8.6-50 MG PO TABS
2.0000 | ORAL_TABLET | ORAL | Status: DC
  Administered 2013-02-07 – 2013-02-08 (×2): 2 via ORAL
  Filled 2013-02-07 (×2): qty 2

## 2013-02-07 MED ORDER — MORPHINE SULFATE (PF) 0.5 MG/ML IJ SOLN
INTRAMUSCULAR | Status: DC | PRN
  Administered 2013-02-07: 3 mg via EPIDURAL

## 2013-02-07 MED ORDER — SODIUM BICARBONATE 8.4 % IV SOLN
INTRAVENOUS | Status: DC | PRN
  Administered 2013-02-07: 2 mL via EPIDURAL
  Administered 2013-02-07 (×2): 5 mL via EPIDURAL
  Administered 2013-02-07: 3 mL via EPIDURAL
  Administered 2013-02-07: 2 mL via EPIDURAL
  Administered 2013-02-07: 3 mL via EPIDURAL

## 2013-02-07 MED ORDER — PROMETHAZINE HCL 25 MG/ML IJ SOLN: 6.2500 mg | INTRAMUSCULAR | Status: DC | PRN

## 2013-02-07 MED ORDER — BUPIVACAINE HCL (PF) 0.5 % IJ SOLN
INTRAMUSCULAR | Status: AC
  Filled 2013-02-07: qty 30

## 2013-02-07 MED ORDER — KETOROLAC TROMETHAMINE 30 MG/ML IJ SOLN: 30.0000 mg | Freq: Four times a day (QID) | INTRAMUSCULAR | Status: AC | PRN

## 2013-02-07 MED ORDER — DIPHENHYDRAMINE HCL 25 MG PO CAPS: 25.0000 mg | ORAL_CAPSULE | Freq: Four times a day (QID) | ORAL | Status: DC | PRN

## 2013-02-07 MED ORDER — OXYCODONE-ACETAMINOPHEN 5-325 MG PO TABS
1.0000 | ORAL_TABLET | ORAL | Status: DC | PRN
  Administered 2013-02-08 – 2013-02-09 (×4): 1 via ORAL
  Administered 2013-02-09: 2 via ORAL
  Filled 2013-02-07 (×4): qty 1
  Filled 2013-02-07: qty 2

## 2013-02-07 MED ORDER — FENTANYL CITRATE 0.05 MG/ML IJ SOLN
25.0000 ug | INTRAMUSCULAR | Status: DC | PRN
  Administered 2013-02-07: 50 ug via INTRAVENOUS

## 2013-02-07 MED ORDER — SIMETHICONE 80 MG PO CHEW
80.0000 mg | CHEWABLE_TABLET | ORAL | Status: DC
  Administered 2013-02-08 – 2013-02-09 (×2): 80 mg via ORAL
  Filled 2013-02-07 (×2): qty 1

## 2013-02-07 MED ORDER — OXYTOCIN 10 UNIT/ML IJ SOLN
40.0000 [IU] | INTRAVENOUS | Status: DC | PRN
  Administered 2013-02-07: 40 [IU] via INTRAVENOUS

## 2013-02-07 MED ORDER — TETANUS-DIPHTH-ACELL PERTUSSIS 5-2.5-18.5 LF-MCG/0.5 IM SUSP: 0.5000 mL | Freq: Once | INTRAMUSCULAR | Status: DC

## 2013-02-07 MED ORDER — EPHEDRINE 5 MG/ML INJ
10.0000 mg | INTRAVENOUS | Status: DC | PRN
  Filled 2013-02-07: qty 2

## 2013-02-07 MED ORDER — DEXTROSE 5 % IV SOLN
1.0000 ug/kg/h | INTRAVENOUS | Status: DC | PRN
  Filled 2013-02-07: qty 2

## 2013-02-07 MED ORDER — DIPHENHYDRAMINE HCL 25 MG PO CAPS: 25.0000 mg | ORAL_CAPSULE | ORAL | Status: DC | PRN

## 2013-02-07 MED ORDER — LACTATED RINGERS IV SOLN
INTRAVENOUS | Status: DC | PRN
  Administered 2013-02-07 (×2): via INTRAVENOUS

## 2013-02-07 MED ORDER — LANOLIN HYDROUS EX OINT: 1.0000 "application " | TOPICAL_OINTMENT | CUTANEOUS | Status: DC | PRN

## 2013-02-07 MED ORDER — SIMETHICONE 80 MG PO CHEW
80.0000 mg | CHEWABLE_TABLET | Freq: Three times a day (TID) | ORAL | Status: DC
  Administered 2013-02-07 – 2013-02-09 (×4): 80 mg via ORAL
  Filled 2013-02-07 (×4): qty 1

## 2013-02-07 MED ORDER — LACTATED RINGERS IV SOLN
INTRAVENOUS | Status: DC
  Administered 2013-02-08: 05:00:00 via INTRAVENOUS

## 2013-02-07 MED ORDER — FENTANYL 2.5 MCG/ML BUPIVACAINE 1/10 % EPIDURAL INFUSION (WH - ANES)
14.0000 mL/h | INTRAMUSCULAR | Status: DC | PRN
  Administered 2013-02-07 (×2): 14 mL/h via EPIDURAL
  Filled 2013-02-07 (×2): qty 125

## 2013-02-07 MED ORDER — FENTANYL CITRATE 0.05 MG/ML IJ SOLN
INTRAMUSCULAR | Status: AC
  Filled 2013-02-07: qty 2

## 2013-02-07 MED ORDER — ONDANSETRON HCL 4 MG/2ML IJ SOLN
INTRAMUSCULAR | Status: AC
  Filled 2013-02-07: qty 2

## 2013-02-07 MED ORDER — ONDANSETRON HCL 4 MG/2ML IJ SOLN: 4.0000 mg | Freq: Four times a day (QID) | INTRAMUSCULAR | Status: DC | PRN

## 2013-02-07 MED ORDER — SIMETHICONE 80 MG PO CHEW: 80.0000 mg | CHEWABLE_TABLET | ORAL | Status: DC | PRN

## 2013-02-07 MED ORDER — EPHEDRINE 5 MG/ML INJ
10.0000 mg | INTRAVENOUS | Status: DC | PRN
  Filled 2013-02-07: qty 4
  Filled 2013-02-07: qty 2

## 2013-02-07 MED ORDER — PRENATAL MULTIVITAMIN CH
1.0000 | ORAL_TABLET | Freq: Every day | ORAL | Status: DC
  Administered 2013-02-08 – 2013-02-09 (×2): 1 via ORAL
  Filled 2013-02-07 (×2): qty 1

## 2013-02-07 MED ORDER — CITRIC ACID-SODIUM CITRATE 334-500 MG/5ML PO SOLN
30.0000 mL | ORAL | Status: DC | PRN
  Administered 2013-02-07: 30 mL via ORAL
  Filled 2013-02-07: qty 15

## 2013-02-07 MED ORDER — WITCH HAZEL-GLYCERIN EX PADS: 1.0000 "application " | MEDICATED_PAD | CUTANEOUS | Status: DC | PRN

## 2013-02-07 MED ORDER — KETOROLAC TROMETHAMINE 30 MG/ML IJ SOLN: 15.0000 mg | Freq: Once | INTRAMUSCULAR | Status: DC | PRN

## 2013-02-07 MED ORDER — LACTATED RINGERS IV SOLN
INTRAVENOUS | Status: DC
  Administered 2013-02-07 (×2): 125 mL/h via INTRAVENOUS

## 2013-02-07 MED ORDER — ONDANSETRON HCL 4 MG/2ML IJ SOLN: 4.0000 mg | Freq: Three times a day (TID) | INTRAMUSCULAR | Status: DC | PRN

## 2013-02-07 MED ORDER — ZOLPIDEM TARTRATE 5 MG PO TABS: 5.0000 mg | ORAL_TABLET | Freq: Every evening | ORAL | Status: DC | PRN

## 2013-02-07 MED ORDER — ONDANSETRON HCL 4 MG/2ML IJ SOLN: 4.0000 mg | INTRAMUSCULAR | Status: DC | PRN

## 2013-02-07 MED ORDER — OXYTOCIN BOLUS FROM INFUSION: 500.0000 mL | INTRAVENOUS | Status: DC

## 2013-02-07 MED ORDER — PHENYLEPHRINE 40 MCG/ML (10ML) SYRINGE FOR IV PUSH (FOR BLOOD PRESSURE SUPPORT)
80.0000 ug | PREFILLED_SYRINGE | INTRAVENOUS | Status: DC | PRN
  Filled 2013-02-07: qty 2
  Filled 2013-02-07: qty 10

## 2013-02-07 MED ORDER — OXYTOCIN 40 UNITS IN LACTATED RINGERS INFUSION - SIMPLE MED
62.5000 mL/h | INTRAVENOUS | Status: DC
Start: 2013-02-07 — End: 2013-02-07
  Filled 2013-02-07: qty 1000

## 2013-02-07 MED ORDER — BUPIVACAINE HCL (PF) 0.5 % IJ SOLN
INTRAMUSCULAR | Status: DC | PRN
  Administered 2013-02-07: 30 mL

## 2013-02-07 MED ORDER — ACETAMINOPHEN 325 MG PO TABS: 650.0000 mg | ORAL_TABLET | ORAL | Status: DC | PRN

## 2013-02-07 MED ORDER — GENTAMICIN SULFATE 40 MG/ML IJ SOLN
Freq: Once | INTRAVENOUS | Status: AC
  Administered 2013-02-07: 117 mL via INTRAVENOUS
  Filled 2013-02-07: qty 11

## 2013-02-07 MED ORDER — DIPHENHYDRAMINE HCL 50 MG/ML IJ SOLN: 12.5000 mg | INTRAMUSCULAR | Status: DC | PRN

## 2013-02-07 MED ORDER — GENTAMICIN SULFATE 40 MG/ML IJ SOLN: 5.0000 mg/kg | INTRAVENOUS | Status: DC

## 2013-02-07 MED ORDER — MAGNESIUM HYDROXIDE 400 MG/5ML PO SUSP: 30.0000 mL | ORAL | Status: DC | PRN

## 2013-02-07 MED ORDER — ONDANSETRON HCL 4 MG PO TABS: 4.0000 mg | ORAL_TABLET | ORAL | Status: DC | PRN

## 2013-02-07 MED ORDER — METOCLOPRAMIDE HCL 5 MG/ML IJ SOLN: 10.0000 mg | Freq: Three times a day (TID) | INTRAMUSCULAR | Status: DC | PRN

## 2013-02-07 MED ORDER — NALOXONE HCL 0.4 MG/ML IJ SOLN: 0.4000 mg | INTRAMUSCULAR | Status: DC | PRN

## 2013-02-07 MED ORDER — MORPHINE SULFATE 0.5 MG/ML IJ SOLN
INTRAMUSCULAR | Status: AC
  Filled 2013-02-07: qty 10

## 2013-02-07 MED ORDER — DIBUCAINE 1 % RE OINT: 1.0000 "application " | TOPICAL_OINTMENT | RECTAL | Status: DC | PRN

## 2013-02-07 MED ORDER — IBUPROFEN 600 MG PO TABS: 600.0000 mg | ORAL_TABLET | Freq: Four times a day (QID) | ORAL | Status: DC | PRN

## 2013-02-07 MED ORDER — CLINDAMYCIN PHOSPHATE 900 MG/50ML IV SOLN: 900.0000 mg | INTRAVENOUS | Status: DC

## 2013-02-07 MED ORDER — MEPERIDINE HCL 25 MG/ML IJ SOLN: 6.2500 mg | INTRAMUSCULAR | Status: DC | PRN

## 2013-02-07 MED ORDER — OXYTOCIN 10 UNIT/ML IJ SOLN
INTRAMUSCULAR | Status: AC
  Filled 2013-02-07: qty 4

## 2013-02-07 MED ORDER — IBUPROFEN 600 MG PO TABS
600.0000 mg | ORAL_TABLET | Freq: Four times a day (QID) | ORAL | Status: DC
  Administered 2013-02-08 – 2013-02-09 (×5): 600 mg via ORAL
  Filled 2013-02-07 (×6): qty 1

## 2013-02-07 MED ORDER — LACTATED RINGERS IV SOLN
500.0000 mL | Freq: Once | INTRAVENOUS | Status: AC
  Administered 2013-02-07: 500 mL via INTRAVENOUS

## 2013-02-07 MED ORDER — SCOPOLAMINE 1 MG/3DAYS TD PT72
1.0000 | MEDICATED_PATCH | Freq: Once | TRANSDERMAL | Status: DC
  Administered 2013-02-07: 1.5 mg via TRANSDERMAL

## 2013-02-07 MED ORDER — ONDANSETRON HCL 4 MG/2ML IJ SOLN
INTRAMUSCULAR | Status: DC | PRN
  Administered 2013-02-07: 4 mg via INTRAVENOUS

## 2013-02-07 MED ORDER — LIDOCAINE HCL (PF) 1 % IJ SOLN
30.0000 mL | INTRAMUSCULAR | Status: DC | PRN
  Filled 2013-02-07: qty 30

## 2013-02-07 MED ORDER — OXYTOCIN 40 UNITS IN LACTATED RINGERS INFUSION - SIMPLE MED: 62.5000 mL/h | INTRAVENOUS | Status: AC

## 2013-02-07 MED ORDER — MEPERIDINE HCL 25 MG/ML IJ SOLN
INTRAMUSCULAR | Status: DC | PRN
  Administered 2013-02-07 (×2): 12.5 mg via INTRAVENOUS

## 2013-02-07 MED ORDER — FENTANYL CITRATE 0.05 MG/ML IJ SOLN
INTRAMUSCULAR | Status: DC | PRN
  Administered 2013-02-07: 50 ug via EPIDURAL

## 2013-02-07 MED ORDER — LACTATED RINGERS IV SOLN: 500.0000 mL | INTRAVENOUS | Status: DC | PRN

## 2013-02-07 MED ORDER — KETOROLAC TROMETHAMINE 30 MG/ML IJ SOLN
30.0000 mg | Freq: Four times a day (QID) | INTRAMUSCULAR | Status: AC | PRN
  Administered 2013-02-07: 30 mg via INTRAMUSCULAR

## 2013-02-07 MED ORDER — SODIUM CHLORIDE 0.9 % IJ SOLN: 3.0000 mL | INTRAMUSCULAR | Status: DC | PRN

## 2013-02-07 MED ORDER — PHENYLEPHRINE 40 MCG/ML (10ML) SYRINGE FOR IV PUSH (FOR BLOOD PRESSURE SUPPORT)
80.0000 ug | PREFILLED_SYRINGE | INTRAVENOUS | Status: DC | PRN
  Filled 2013-02-07: qty 2

## 2013-02-07 MED ORDER — PNEUMOCOCCAL VAC POLYVALENT 25 MCG/0.5ML IJ INJ
0.5000 mL | INJECTION | INTRAMUSCULAR | Status: DC
  Filled 2013-02-07: qty 0.5

## 2013-02-07 MED ORDER — KETOROLAC TROMETHAMINE 30 MG/ML IJ SOLN
INTRAMUSCULAR | Status: AC
  Administered 2013-02-07: 30 mg via INTRAMUSCULAR
  Filled 2013-02-07: qty 1

## 2013-02-07 MED ORDER — LACTATED RINGERS IV SOLN
INTRAVENOUS | Status: DC | PRN
  Administered 2013-02-07: 21:00:00 via INTRAVENOUS

## 2013-02-07 MED ORDER — MENTHOL 3 MG MT LOZG: 1.0000 | LOZENGE | OROMUCOSAL | Status: DC | PRN

## 2013-02-07 MED ORDER — FLEET ENEMA 7-19 GM/118ML RE ENEM: 1.0000 | ENEMA | RECTAL | Status: DC | PRN

## 2013-02-07 MED ORDER — SCOPOLAMINE 1 MG/3DAYS TD PT72
MEDICATED_PATCH | TRANSDERMAL | Status: AC
  Administered 2013-02-07: 1.5 mg via TRANSDERMAL
  Filled 2013-02-07: qty 1

## 2013-02-07 MED ORDER — DIPHENHYDRAMINE HCL 50 MG/ML IJ SOLN: 25.0000 mg | INTRAMUSCULAR | Status: DC | PRN

## 2013-02-07 MED ORDER — MEASLES, MUMPS & RUBELLA VAC ~~LOC~~ INJ
0.5000 mL | INJECTION | Freq: Once | SUBCUTANEOUS | Status: DC
  Filled 2013-02-07: qty 0.5

## 2013-02-07 MED ORDER — MEPERIDINE HCL 25 MG/ML IJ SOLN
INTRAMUSCULAR | Status: AC
  Filled 2013-02-07: qty 1

## 2013-02-07 SURGICAL SUPPLY — 44 items
APL SKNCLS STERI-STRIP NONHPOA (GAUZE/BANDAGES/DRESSINGS) ×1
BARRIER ADHS 3X4 INTERCEED (GAUZE/BANDAGES/DRESSINGS) IMPLANT
BENZOIN TINCTURE PRP APPL 2/3 (GAUZE/BANDAGES/DRESSINGS) ×1 IMPLANT
BRR ADH 4X3 ABS CNTRL BYND (GAUZE/BANDAGES/DRESSINGS)
CLAMP CORD UMBIL (MISCELLANEOUS) IMPLANT
CLOTH BEACON ORANGE TIMEOUT ST (SAFETY) ×2 IMPLANT
CONTAINER PREFILL 10% NBF 15ML (MISCELLANEOUS) IMPLANT
DRAPE LG THREE QUARTER DISP (DRAPES) IMPLANT
DRSG OPSITE POSTOP 4X10 (GAUZE/BANDAGES/DRESSINGS) ×2 IMPLANT
DURAPREP 26ML APPLICATOR (WOUND CARE) ×2 IMPLANT
ELECT REM PT RETURN 9FT ADLT (ELECTROSURGICAL) ×2
ELECTRODE REM PT RTRN 9FT ADLT (ELECTROSURGICAL) ×1 IMPLANT
GLOVE BIO SURGEON STRL SZ 6.5 (GLOVE) ×2 IMPLANT
GLOVE BIOGEL PI IND STRL 7.5 (GLOVE) IMPLANT
GLOVE BIOGEL PI INDICATOR 7.5 (GLOVE) ×1
GLOVE SURG SS PI 7.5 STRL IVOR (GLOVE) ×1 IMPLANT
GOWN PREVENTION PLUS XLARGE (GOWN DISPOSABLE) ×4 IMPLANT
GOWN STRL REIN XL XLG (GOWN DISPOSABLE) ×4 IMPLANT
KIT ABG SYR 3ML LUER SLIP (SYRINGE) IMPLANT
NDL HYPO 25X5/8 SAFETYGLIDE (NEEDLE) IMPLANT
NDL SPNL 18GX3.5 QUINCKE PK (NEEDLE) ×1 IMPLANT
NEEDLE HYPO 25X5/8 SAFETYGLIDE (NEEDLE) IMPLANT
NEEDLE SPNL 18GX3.5 QUINCKE PK (NEEDLE) ×2 IMPLANT
NS IRRIG 1000ML POUR BTL (IV SOLUTION) ×2 IMPLANT
PACK C SECTION WH (CUSTOM PROCEDURE TRAY) ×2 IMPLANT
PAD OB MATERNITY 4.3X12.25 (PERSONAL CARE ITEMS) ×2 IMPLANT
STRIP CLOSURE SKIN 1/2X4 (GAUZE/BANDAGES/DRESSINGS) ×1 IMPLANT
SUT PDS AB 0 CTX 60 (SUTURE) IMPLANT
SUT PLAIN 2 0 XLH (SUTURE) ×1 IMPLANT
SUT VIC AB 0 CT1 27 (SUTURE)
SUT VIC AB 0 CT1 27XBRD ANBCTR (SUTURE) IMPLANT
SUT VIC AB 0 CT1 36 (SUTURE) IMPLANT
SUT VIC AB 2-0 CT1 27 (SUTURE) ×2
SUT VIC AB 2-0 CT1 TAPERPNT 27 (SUTURE) ×1 IMPLANT
SUT VIC AB 2-0 CTX 36 (SUTURE) ×4 IMPLANT
SUT VIC AB 3-0 CT1 27 (SUTURE) ×2
SUT VIC AB 3-0 CT1 TAPERPNT 27 (SUTURE) ×1 IMPLANT
SUT VIC AB 3-0 SH 27 (SUTURE)
SUT VIC AB 3-0 SH 27X BRD (SUTURE) IMPLANT
SUT VIC AB 4-0 KS 27 (SUTURE) ×1 IMPLANT
SYR 30ML LL (SYRINGE) ×2 IMPLANT
TOWEL OR 17X24 6PK STRL BLUE (TOWEL DISPOSABLE) ×2 IMPLANT
TRAY FOLEY CATH 14FR (SET/KITS/TRAYS/PACK) ×2 IMPLANT
WATER STERILE IRR 1000ML POUR (IV SOLUTION) ×2 IMPLANT

## 2013-02-07 NOTE — H&P (Signed)
[name] is a 22 y.o. female presenting for PROM at term at 3am. Maternal Medical History:  Reason for admission: Rupture of membranes and contractions.  Nausea.  Contractions: Onset was 3-5 hours ago.   Frequency: regular.   Perceived severity is moderate.    Fetal activity: Perceived fetal activity is normal.   Last perceived fetal movement was within the past hour.    Prenatal Complications - Diabetes: none.    OB History   Grav Para Term Preterm Abortions TAB SAB Ect Mult Living   2 1 1       1      Past Medical History  Diagnosis Date  . Depression   . Pregnancy induced hypertension    Past Surgical History  Procedure Laterality Date  . Gallbladder surgery     Family History: family history includes Diabetes in her other; Heart disease in her other; Hypertension in her other; Lupus in her other; Thyroid disease in her other. Social History:  reports that she has quit smoking. Her smoking use included Cigarettes. She smoked 0.00 packs per day. She has never used smokeless tobacco. She reports that she does not drink alcohol or use illicit drugs.   Prenatal Transfer Tool  Maternal Diabetes: No Genetic Screening: Normal Maternal Ultrasounds/Referrals: Normal Fetal Ultrasounds or other Referrals:  None Maternal Substance Abuse:  No but has used some Percocet for back pain Significant Maternal Medications:  None Significant Maternal Lab Results:  None GBS Negative Other Comments:  None  Review of Systems  Constitutional: Negative for fever, chills and malaise/fatigue.  Gastrointestinal: Positive for abdominal pain. Negative for nausea, vomiting, diarrhea and constipation.  Genitourinary: Negative for dysuria.  Musculoskeletal: Negative for myalgias.  Neurological: Negative for dizziness and headaches.    Dilation: 4.5 Effacement (%): 80 Station: -2 Exam by:: H Koran RNC Blood pressure 114/65, pulse 101, temperature 97.6 F (36.4 C), temperature  source Oral, resp. rate 20, height 5\' 5"  (1.651 m), weight 87.454 kg (192 lb 12.8 oz), last menstrual period 05/19/2012, SpO2 98.00%. Maternal Exam:  Uterine Assessment: Contraction strength is moderate.  Contraction frequency is regular.   Abdomen: Fundal height is 40.   Estimated fetal weight is 7.   Fetal presentation: vertex  Introitus: Normal vulva. Normal vagina.  Vagina is negative for discharge.  Ferning test: positive.  Nitrazine test: not done. Amniotic fluid character: clear.  Pelvis: adequate for delivery.   Cervix: Cervix evaluated by digital exam.     Fetal Exam Fetal Monitor Review: Mode: ultrasound.   Baseline rate: 130.  Variability: moderate (6-25 bpm).   Pattern: accelerations present and no decelerations.    Fetal State Assessment: Category I - tracings are normal.     Physical Exam  Constitutional: She is oriented to person, place, and time. She appears well-developed and well-nourished. No distress.  HENT:  Head: Normocephalic.  Cardiovascular: Normal rate.   Respiratory: Effort normal.  GI: Soft. She exhibits no distension. There is no tenderness. There is no rebound and no guarding.  Genitourinary: Vagina normal and uterus normal. No vaginal discharge found.  Musculoskeletal: Normal range of motion.  Neurological: She is alert and oriented to person, place, and time.  Skin: Skin is warm and dry.  Psychiatric: She has a normal mood and affect.    Prenatal labs: ABO, Rh: A/POS/-- (06/09 1532) Antibody: NEG (10/02 0910) Rubella: 1.11 (06/09 1532) RPR: NON REAC (10/02 0910)  HBsAg: NEGATIVE (06/09 1532)  HIV: NON REACTIVE (10/02 0910)  GBS: NEGATIVE (12/08  1130)   Assessment/Plan: A:  SIUP at [redacted]w[redacted]d       PROM at term      Early active labor  P:  Admit      Routine labor orders      Epidural      Anticipate SVD  [facility] 02/07/2013, 9:44 AM

## 2013-02-07 NOTE — Transfer of Care (Signed)
Immediate Anesthesia Transfer of Care Note  Patient: [name]  Procedure(s) Performed: Procedure(s): CESAREAN SECTION (N/A)  Patient Location: PACU  Anesthesia Type:Epidural  Level of Consciousness: awake, alert , oriented and patient cooperative  Airway & Oxygen Therapy: Patient Spontanous Breathing  Post-op Assessment: Report given to PACU RN and Post -op Vital signs reviewed and stable  Post vital signs: stable  Complications: No apparent anesthesia complications

## 2013-02-07 NOTE — Progress Notes (Signed)
[name] is a 22 y.o. G2P1001 at [redacted]w[redacted]d by ultrasound admitted for rupture of membranes  Subjective: Has been pushing for over one hour s/p vacuum attempt. Is getting tired and requesting to stop pushing and have a C/S.  Objective: BP 136/74  Pulse 120  Temp(Src) 98 F (36.7 C) (Oral)  Resp 22  Ht 5\' 5"  (1.651 m)  Wt 87.454 kg (192 lb 12.8 oz)  BMI 32.08 kg/m2  SpO2 98%  LMP 05/19/2012      FHT:  FHR: 140 bpm, variability: moderate,  accelerations:  Present,  decelerations:  Absent UC:   regular, every 2 minutes  SVE:   Dilation: 10 Effacement (%): 100 Station: +1 Exam by:: H Koran RNC  Examined during pushing. While caput has descended a little lower with pushing, and pushing efforts are good, the bony skull has not and does not descend with pushing. There is some molding but no movement of bones with pushing.  Leading edge of bony skull is at 0-+1. Discussed options including, rest, continued pushing, reattempting vacuum (if Dr Marice Potter agrees) or Cesarean Delivery.  Pt states "I am done, I cannot push anymore" States she wants a C/S.  Labs: Lab Results  Component Value Date   WBC 11.7* 02/07/2013   HGB 12.7 02/07/2013   HCT 37.1 02/07/2013   MCV 76.7* 02/07/2013   PLT 221 02/07/2013    Assessment / Plan: Arrest of descent, S/P prolonged second stage   Labor: See above Preeclampsia:  n/a Fetal Wellbeing:  Category II Pain Control:  Epidural I/D:  n/a Anticipated MOD:  Cesarean delivery   [facility] 02/07/2013, 7:30 PM

## 2013-02-07 NOTE — Progress Notes (Signed)
Patient ID: [name], female   DOB: 05/28/1990, 22 y.o.   MRN: [id number]  Pushing well  Filed Vitals:   02/07/13 1459 02/07/13 1527 02/07/13 1529 02/07/13 1559  BP: 121/77  125/82 120/72  Pulse: 114 117 108 109  Temp: 98.2 F (36.8 C)   98.1 F (36.7 C)  TempSrc: Oral   Oral  Resp: 22  22 20   Height:      Weight:      SpO2:  98%     FHR reassuring UCs every  Starting to see some bulging with pushes  Will continue to push

## 2013-02-07 NOTE — Progress Notes (Signed)
[name] is a 22 y.o. G2P1001 at [redacted]w[redacted]d by ultrasound admitted for rupture of membranes, latent phase/early active labor  Subjective: Doing well.   Objective: BP 125/79  Pulse 107  Temp(Src) 97.8 F (36.6 C) (Oral)  Resp 18  Ht 5\' 5"  (1.651 m)  Wt 87.454 kg (192 lb 12.8 oz)  BMI 32.08 kg/m2  SpO2 98%  LMP 05/19/2012      FHT:  FHR: 140 bpm, variability: moderate,  accelerations:  Present,  decelerations:  Absent UC:   regular, every 2 minutes SVE:   Dilation: 5.5 Effacement (%): 90 Station: -1 Exam by:: H Koran RNC  Labs: Lab Results  Component Value Date   WBC 11.7* 02/07/2013   HGB 12.7 02/07/2013   HCT 37.1 02/07/2013   MCV 76.7* 02/07/2013   PLT 221 02/07/2013    Assessment / Plan: Spontaneous labor, progressing normally  Labor: Progressing normally Preeclampsia:  n/a Fetal Wellbeing:  Category I Pain Control:   Epidural I/D:  n/a Anticipated MOD:  NSVD  Collette Pescador 02/07/2013, 12:13 PM

## 2013-02-07 NOTE — Anesthesia Preprocedure Evaluation (Addendum)
Anesthesia Evaluation  Patient identified by MRN, date of birth, ID band Patient awake    Reviewed: Allergy & Precautions, H&P , Patient's Chart, lab work & pertinent test results  Airway Mallampati: II TM Distance: >3 FB Neck ROM: full    Dental  (+) Teeth Intact   Pulmonary former smoker,  breath sounds clear to auscultation        Cardiovascular hypertension, Rhythm:regular Rate:Normal     Neuro/Psych    GI/Hepatic   Endo/Other    Renal/GU      Musculoskeletal   Abdominal   Peds  Hematology   Anesthesia Other Findings       Reproductive/Obstetrics (+) Pregnancy                           Anesthesia Physical Anesthesia Plan  ASA: II  Anesthesia Plan: Epidural   Post-op Pain Management:    Induction:   Airway Management Planned:   Additional Equipment:   Intra-op Plan:   Post-operative Plan:   Informed Consent: I have reviewed the patients History and Physical, chart, labs and discussed the procedure including the risks, benefits and alternatives for the proposed anesthesia with the patient or authorized representative who has indicated his/her understanding and acceptance.   Dental Advisory Given  Plan Discussed with:   Anesthesia Plan Comments: (Labs checked- platelets confirmed with RN in room. Fetal heart tracing, per RN, reported to be stable enough for sitting procedure. Discussed epidural, and patient consents to the procedure:  included risk of possible headache,backache, failed block, allergic reaction, and nerve injury. This patient was asked if she had any questions or concerns before the procedure started.)        Anesthesia Quick Evaluation  

## 2013-02-07 NOTE — Op Note (Signed)
PATIENT: .[name]  PRE-OPERATIVE DIAGNOSIS: failure to descend  POST-OPERATIVE DIAGNOSIS: same   PROCEDURE: Procedure(s):  CESAREAN SECTION (N/A)   SURGEON: Surgeon(s) and Role:  * Allie Bossier, MD - Primary    ASSISTANTS: Rulon Abide, MD, Drexel Iha, MD    ANESTHESIA: epidural   IV fluids: 3500cc EBL: 1200cc UOP: 200cc  BLOOD ADMINISTERED:none  DRAINS: none  LOCAL MEDICATIONS USED: MARCAINE   SPECIMEN: Source of Specimen: cord blood  DISPOSITION OF SPECIMEN: PATHOLOGY COUNTS: YES    PATIENT DISPOSITION: PACU - hemodynamically stable.   The risks, benefits, and alternatives of surgery were explained, understood, accepted. Consents were signed. All questions were answered. Epidural anesthesia was dosed to spinal levels without complication. Her abdomen and vagina were prepped and draped in the usual sterile fashion. A Foley catheter had been placed, draining clear urine throughout case. Timeout procedure was done. After adequate anesthesia was assured 30 mL for 0.5% Marcaine was injected into the subcutaneous tissue about 2 cm above the symphysis pubis. An incision was made there. The incision was carried down through the subcutaneous tissue to the fascia. The fascia was scored the midline and extended bilaterally. The middle 20% of the rectus muscles were separated in a transverse fashion using electrosurgical technique. Excellent hemostasis was maintained. The peritoneum was entered with hemostats. Peritoneal incision was extended bilaterally with the Bovie. The bladder blade was placed. A transverse incision was made on the well-developed lower uterine segment. The uterine incision was extended with traction on each side. Clear fluid was noted. The baby was noted to be extremely wedged in the pelvis. The head was delivered with difficulty and the assistance of an assistant pushing up from below.  Direct OP position. The head was flexed during its delivery. The baby's cord  was clamped and cut, and he was transferred to the NICU personnel for routine care. The placenta was delivered intact with traction. The uterus was exteriorized for visualization and the interior was cleaned with a dry lap sponge. The uterine incision was closed with 2-0 Vicryl running locking suture in two layers, the second imbricating the first. Excellent hemostasis was noted. By tilting the uterus each side was able to visualize the adnexa, and they were normal. The rectus fascia rectus muscles were noted be hemostatic as well. The fascia was closed with a 0 vicryl suture in a running nonlocking fashion. No defects were palpable. The subcutaneous tissue was irrigated, cleaned, dried and re-approximated with 0 plain gut. A subcuticular closure was done with a 3-0 Vicryl suture. Steri-Strips are placed. Excellent cosmetic results were obtained. She was taken to the recovery room in stable condition. She tolerated the procedure well.   Vannia Pola, Redmond Baseman, MD

## 2013-02-07 NOTE — MAU Note (Signed)
Birthing suites called to give report. Will call back when nurse is available.

## 2013-02-07 NOTE — Anesthesia Procedure Notes (Signed)

## 2013-02-07 NOTE — Anesthesia Postprocedure Evaluation (Signed)
Anesthesia Post Note  Patient: [name]  Procedure(s) Performed: Procedure(s) (LRB): CESAREAN SECTION (N/A)  Anesthesia type: Epidural  Patient location: PACU  Post pain: Pain level controlled  Post assessment: Post-op Vital signs reviewed  Last Vitals:  Filed Vitals:   02/07/13 2120  BP: 116/58  Pulse: 117  Temp: 36.5 C  Resp: 20    Post vital signs: Reviewed  Level of consciousness: awake  Complications: No apparent anesthesia complications

## 2013-02-08 ENCOUNTER — Encounter (HOSPITAL_COMMUNITY): Payer: Self-pay | Admitting: Obstetrics & Gynecology

## 2013-02-08 ENCOUNTER — Encounter: Payer: Medicaid Other | Admitting: Advanced Practice Midwife

## 2013-02-08 LAB — CBC
MCH: 26.6 pg (ref 26.0–34.0)
MCV: 76.4 fL — ABNORMAL LOW (ref 78.0–100.0)
Platelets: 209 10*3/uL (ref 150–400)
RDW: 14.7 % (ref 11.5–15.5)
WBC: 19 10*3/uL — ABNORMAL HIGH (ref 4.0–10.5)

## 2013-02-08 NOTE — Progress Notes (Signed)
UR completed 

## 2013-02-08 NOTE — Progress Notes (Signed)
Dr. Reola Calkins in room and Dr. Eather Colas of HR- 178 standing . Pt runs tachy all time. Asymptomatic. Dr. Eather Colas of bleeding at honey comb dressing and ordered for pressure bandage over honeycomb.  Dr. Eather Colas of temp. 100 degrees and using inc. Spirometry. Pt stood by bedside and orthostatics obtained.

## 2013-02-08 NOTE — Progress Notes (Signed)
Orthostatic b/p done. Noted Heart rate lower, so ambulated to BR, tolerated WDL. Denies dizziness, pulse did well. F/c removed.

## 2013-02-08 NOTE — Anesthesia Postprocedure Evaluation (Signed)
Anesthesia Post Note  Patient: [name]  Procedure(s) Performed: Procedure(s) (LRB): CESAREAN SECTION (N/A)  Anesthesia type: Epidural  Patient location: Mother/Baby  Post pain: Pain level controlled  Post assessment: Post-op Vital signs reviewed  Last Vitals:  Filed Vitals:   02/08/13 0733  BP: 107/53  Pulse: 123  Temp: 37.4 C  Resp: 20    Post vital signs: Reviewed  Level of consciousness:alert  Complications: No apparent anesthesia complications

## 2013-02-08 NOTE — Progress Notes (Signed)
Subjective: Postpartum Day 1: Cesarean Delivery Patient reports incisional pain and tolerating PO.  Catheter still in bladder.   Objective: Vital signs in last 24 hours: Temp:  [97.6 F (36.4 C)-99.7 F (37.6 C)] 99.7 F (37.6 C) (12/23 0145) Pulse Rate:  [97-149] 120 (12/23 0145) Resp:  [17-22] 20 (12/23 0145) BP: (102-146)/(42-98) 104/71 mmHg (12/23 0145) SpO2:  [96 %-100 %] 97 % (12/23 0145)  Physical Exam:  General: alert, cooperative and no distress Lochia: appropriate Uterine Fundus: firm Incision: healing well, bleeding on honeycomb but all old appearing.  DVT Evaluation: No evidence of DVT seen on physical exam. Negative Homan's sign. No significant calf/ankle edema.   Recent Labs  02/07/13 0735 02/08/13 0550  HGB 12.7 9.7*  HCT 37.1 27.9*    Assessment/Plan: Status post Cesarean section. Doing well postoperatively.  Tachycardia- at baseline prior to delivery was 120s-130s at times.  Will cont to monitor. No s/s of anemia.  D/c foley today Ambulate in hall Pressure dressing to honeycomb.  Continue current care.  Ruthella Kirchman L 02/08/2013, 7:09 AM

## 2013-02-09 MED ORDER — IBUPROFEN 600 MG PO TABS: 600.0000 mg | ORAL_TABLET | Freq: Four times a day (QID) | ORAL | Status: DC

## 2013-02-09 MED ORDER — OXYCODONE-ACETAMINOPHEN 5-325 MG PO TABS: 1.0000 | ORAL_TABLET | ORAL | Status: DC | PRN

## 2013-02-09 NOTE — Clinical Social Work Psychosocial (Signed)
    Clinical Social Work Department BRIEF PSYCHOSOCIAL ASSESSMENT 02/09/2013  Patient:  [name], [name]     Account Number:  [id number]     Admit date:  02/07/2013  Clinical Social Worker:  Melene Plan  Date/Time:  02/09/2013 12:58 PM  Referred by:  Physician  Date Referred:  02/08/2013 Referred for  Behavioral Health Issues   Other Referral:   Hx PP depression   Interview type:  Patient Other interview type:    PSYCHOSOCIAL DATA Living Status:  HUSBAND Admitted from facility:   Level of care:   Primary support name:  Virl Son Primary support relationship to patient:  SPOUSE Degree of support available:   Involved    CURRENT CONCERNS Current Concerns  Behavioral Health Issues   Other Concerns:    SOCIAL WORK ASSESSMENT / PLAN CSW met with pt to assess history of PP depression & offer resources as needed.  Pt lives with her spouse & their 23 year old child.  She acknowledges that she experienced PP depression after the birth of their child but states it was more situational.  She explained that FOB was away in basic training & while she had the support of her mother & mother in-law, she missed her spouse.  Her depression symptoms were treated with Lexapro & again most recently at the beginning of this pregnancy (for anxiety).  She denies any current depression or anxious feeling at this time.  She smiled during the assessment & appears happy & relaxed. FOB at the bedside, aware of history & supportive.  CSW discussed PP depression signs/symptoms & encouraged her to seek medical attention, again if needed.  She agrees.  She has all the necessary supplies for the infant & both parent appear appropriate at this time.   Assessment/plan status:  No Further Intervention Required Other assessment/ plan:   Information/referral to community resources:   Pt agrees to seek medical attention if needed.    PATIENT'S/FAMILY'S RESPONSE TO PLAN OF CARE: Pt was receptive  to information discussed & spoke openly about history.

## 2013-02-09 NOTE — Discharge Summary (Signed)
Obstetric Discharge Summary Reason for Admission: onset of labor Prenatal Procedures: NST and ultrasound Intrapartum Procedures: cesarean: low cervical, transverse Postpartum Procedures: none Complications-Operative and Postpartum: none Hospital Course:  Had spontaneous onset of labor and progressed nicely to C/C/+1.  She pushed a total of 3-4 hours, with a failed vacuum attempt, and had a PLTCS for arrest of descent.  Baby was direct OP.  PP course uneventful.  Baby is latching fair, but she will pump milk if he doesn't learn.  Desires d/c.  Plans Nexplanon  Hemoglobin  Date Value Range Status  02/08/2013 9.7* 12.0 - 15.0 g/dL Final     REPEATED TO VERIFY     DELTA CHECK NOTED     HCT  Date Value Range Status  02/08/2013 27.9* 36.0 - 46.0 % Final    Physical Exam:  General: alert, cooperative and no distress Lochia: appropriate Uterine Fundus: firm Incision: healing well, no significant drainage, Dsg dry/intact DVT Evaluation: No evidence of DVT seen on physical exam. Negative Homan's sign.  Discharge Diagnoses: Term Pregnancy-delivered  Discharge Information: Date: 02/09/2013 Activity: pelvic rest Diet: routine Medications: Ibuprofen and Percocet Condition: stable Instructions: refer to practice specific booklet Discharge to: home   Newborn Data: Live born female  Birth Weight: 8 lb 4.1 oz (3745 g) APGAR: 8, 9  Home with mother.  [name],[name] 02/09/2013, 7:44 AM

## 2013-02-14 NOTE — Discharge Summary (Signed)
Attestation of Attending Supervision of Obstetric Fellow: Evaluation and management procedures were performed by the Obstetric Fellow under my supervision and collaboration.  I have reviewed the Obstetric Fellow's note and chart, and I agree with the management and plan.  UGONNA  ANYANWU, MD, FACOG Attending Obstetrician & Gynecologist Faculty Practice, [facility]   

## 2013-02-15 NOTE — Transfer of Care (Deleted)
Immediate Anesthesia Transfer of Care Note  Patient: [name]  Procedure(s) Performed: Procedure(s): CESAREAN SECTION (N/A)  Patient Location: PACU  Anesthesia Type:Epidural  Level of Consciousness: awake, alert , oriented and patient cooperative  Airway & Oxygen Therapy: Patient Spontanous Breathing  Post-op Assessment: Report given to PACU RN and Post -op Vital signs reviewed and stable  Post vital signs: stable  Complications: No apparent anesthesia complications 

## 2013-02-18 ENCOUNTER — Ambulatory Visit: Payer: Medicaid Other | Admitting: Obstetrics and Gynecology

## 2013-02-21 ENCOUNTER — Ambulatory Visit (INDEPENDENT_AMBULATORY_CARE_PROVIDER_SITE_OTHER): Payer: Medicaid Other | Admitting: Obstetrics and Gynecology

## 2013-02-21 ENCOUNTER — Encounter: Payer: Self-pay | Admitting: Obstetrics and Gynecology

## 2013-02-21 VITALS — BP 102/78 | Ht 65.0 in | Wt 165.6 lb

## 2013-02-21 DIAGNOSIS — Z9889 Other specified postprocedural states: Secondary | ICD-10-CM

## 2013-02-21 DIAGNOSIS — Z98891 History of uterine scar from previous surgery: Secondary | ICD-10-CM

## 2013-02-21 NOTE — Progress Notes (Signed)
Patient ID: [name], female   DOB: [birth year], 23 y.o.   MRN: [id number]   This chart was transcribed for Christin BachJohn Charla Criscione, MD by Leone PayorSonum Patel, ED scribe.  Subjective:  Stacey HuhJennifer L Mitchell is a 23 y.o. female who presents to the clinic 1 weeks status post cesarean section. Pt states she pushed for about 3-4 hours and had a failed vacuum attempt. [place] advised pt to reduce dairy intake to prevent her child to be constipated from her breast milk. She reports having redness and swelling of the left breast and states she has stopped breast feeding for the past 4 days. She has switched over to formula and does not want to continue breast feeding at this time.  Review of Systems Negative except having trouble removing the glue from the tape that was applied after the procedure. She also has redness and swelling of the left breast.   She has been eating a regular diet without difficulty.   Bowel movements are normal. Pain is controlled without any medications.  Objective:  BP 102/78  Ht 5\' 5"  (1.651 m)  Wt 165 lb 9.6 oz (75.116 kg)  BMI 27.56 kg/m2  LMP 05/19/2012  Breastfeeding? No General:Well developed, well nourished.  No acute distress. Abdomen: Bowel sounds normal, soft, non-tender.   Incision(s):   Healing well, no drainage, no erythema, no hernia, no swelling, no dehiscence, incision well approximated.   Assessment:  Post-Op 10 days s/p cesarean section   breast engorgement s/p changing to bottle feeding Doing well postoperatively.   Plan:  1.Wound care discussed  2. .Continue any current medications. 3. Activity restrictions: routine post cesarean inst continue 4. return to work: not applicable. 5. Follow up in 2 weeks.

## 2013-02-23 ENCOUNTER — Ambulatory Visit: Payer: Medicaid Other | Admitting: Obstetrics and Gynecology

## 2013-03-09 ENCOUNTER — Ambulatory Visit (INDEPENDENT_AMBULATORY_CARE_PROVIDER_SITE_OTHER): Payer: Medicaid Other | Admitting: Women's Health

## 2013-03-09 ENCOUNTER — Encounter: Payer: Self-pay | Admitting: Women's Health

## 2013-03-09 VITALS — BP 110/70 | Wt 170.0 lb

## 2013-03-09 DIAGNOSIS — F418 Other specified anxiety disorders: Secondary | ICD-10-CM

## 2013-03-09 DIAGNOSIS — Z98891 History of uterine scar from previous surgery: Secondary | ICD-10-CM

## 2013-03-09 DIAGNOSIS — Z3480 Encounter for supervision of other normal pregnancy, unspecified trimester: Secondary | ICD-10-CM

## 2013-03-09 MED ORDER — ESCITALOPRAM OXALATE 20 MG PO TABS: 20.0000 mg | ORAL_TABLET | Freq: Every day | ORAL | Status: DC

## 2013-03-09 MED ORDER — LORAZEPAM 0.5 MG PO TABS: 0.5000 mg | ORAL_TABLET | Freq: Two times a day (BID) | ORAL | Status: DC | PRN

## 2013-03-09 NOTE — Patient Instructions (Signed)
Depression, Adult °Depression refers to feeling sad, low, down in the dumps, blue, gloomy, or empty. In general, there are two kinds of depression: °1. Depression that we all experience from time to time because of upsetting life experiences, including the loss of a job or the ending of a relationship (normal sadness or normal grief). This kind of depression is considered normal, is short lived, and resolves within a few days to 2 weeks. (Depression experienced after the loss of a loved one is called bereavement. Bereavement often lasts longer than 2 weeks but normally gets better with time.) °2. Clinical depression, which lasts longer than normal sadness or normal grief or interferes with your ability to function at home, at work, and in school. It also interferes with your personal relationships. It affects almost every aspect of your life. Clinical depression is an illness. °Symptoms of depression also can be caused by conditions other than normal sadness and grief or clinical depression. Examples of these conditions are listed as follows: °· Physical illness Some physical illnesses, including underactive thyroid gland (hypothyroidism), severe anemia, specific types of cancer, diabetes, uncontrolled seizures, heart and lung problems, strokes, and chronic pain are commonly associated with symptoms of depression. °· Side effects of some prescription medicine In some people, certain types of prescription medicine can cause symptoms of depression. °· Substance abuse Abuse of alcohol and illicit drugs can cause symptoms of depression. °SYMPTOMS °Symptoms of normal sadness and normal grief include the following: °· Feeling sad or crying for short periods of time. °· Not caring about anything (apathy). °· Difficulty sleeping or sleeping too much. °· No longer able to enjoy the things you used to enjoy. °· Desire to be by oneself all the time (social isolation). °· Lack of energy or motivation. °· Difficulty  concentrating or remembering. °· Change in appetite or weight. °· Restlessness or agitation. °Symptoms of clinical depression include the same symptoms of normal sadness or normal grief and also the following symptoms: °· Feeling sad or crying all the time. °· Feelings of guilt or worthlessness. °· Feelings of hopelessness or helplessness. °· Thoughts of suicide or the desire to harm yourself (suicidal ideation). °· Loss of touch with reality (psychotic symptoms). Seeing or hearing things that are not real (hallucinations) or having false beliefs about your life or the people around you (delusions and paranoia). °DIAGNOSIS  °The diagnosis of clinical depression usually is based on the severity and duration of the symptoms. Your caregiver also will ask you questions about your medical history and substance use to find out if physical illness, use of prescription medicine, or substance abuse is causing your depression. Your caregiver also may order blood tests. °TREATMENT  °Typically, normal sadness and normal grief do not require treatment. However, sometimes antidepressant medicine is prescribed for bereavement to ease the depressive symptoms until they resolve. °The treatment for clinical depression depends on the severity of your symptoms but typically includes antidepressant medicine, counseling with a mental health professional, or a combination of both. Your caregiver will help to determine what treatment is best for you. °Depression caused by physical illness usually goes away with appropriate medical treatment of the illness. If prescription medicine is causing depression, talk with your caregiver about stopping the medicine, decreasing the dose, or substituting another medicine. °Depression caused by abuse of alcohol or illicit drugs abuse goes away with abstinence from these substances. Some adults need professional help in order to stop drinking or using drugs. °SEEK IMMEDIATE CARE IF: °· You have   thoughts  about hurting yourself or others. °· You lose touch with reality (have psychotic symptoms). °· You are taking medicine for depression and have a serious side effect. °FOR MORE INFORMATION °National Alliance on Mental Illness: www.nami.org  °National Institute of Mental Health: www.nimh.nih.gov  °Document Released: 02/01/2000 Document Revised: 08/05/2011 Document Reviewed: 05/05/2011 °ExitCare® Patient Information ©2014 [employer]. °Generalized Anxiety Disorder °Generalized anxiety disorder (GAD) is a mental disorder. It interferes with life functions, including relationships, work, and school. °GAD is different from normal anxiety, which everyone experiences at some point in their lives in response to specific life events and activities. Normal anxiety actually helps us prepare for and get through these life events and activities. Normal anxiety goes away after the event or activity is over.  °GAD causes anxiety that is not necessarily related to specific events or activities. It also causes excess anxiety in proportion to specific events or activities. The anxiety associated with GAD is also difficult to control. GAD can vary from mild to severe. People with severe GAD can have intense waves of anxiety with physical symptoms (panic attacks).  °SYMPTOMS °The anxiety and worry associated with GAD are difficult to control. This anxiety and worry are related to many life events and activities and also occur more days than not for 6 months or longer. People with GAD also have three or more of the following symptoms (one or more in children): °· Restlessness.   °· Fatigue. °· Difficulty concentrating.   °· Irritability. °· Muscle tension. °· Difficulty sleeping or unsatisfying sleep. °DIAGNOSIS °GAD is diagnosed through an assessment by your caregiver. Your caregiver will ask you questions about your mood, physical symptoms, and events in your life. Your caregiver may ask you about your medical history and use of  alcohol or drugs, including prescription medications. Your caregiver may also do a physical exam and blood tests. Certain medical conditions and the use of certain substances can cause symptoms similar to those associated with GAD. Your caregiver may refer you to a mental health specialist for further evaluation. °TREATMENT °The following therapies are usually used to treat GAD:  °· Medication Antidepressant medication usually is prescribed for long-term daily control. Antianxiety medications may be added in severe cases, especially when panic attacks occur.   °· Talk therapy (psychotherapy) Certain types of talk therapy can be helpful in treating GAD by providing support, education, and guidance. A form of talk therapy called cognitive behavioral therapy can teach you healthy ways to think about and react to daily life events and activities. °· Stress management techniques These include yoga, meditation, and exercise and can be very helpful when they are practiced regularly. °A mental health specialist can help determine which treatment is best for you. Some people see improvement with one therapy. However, other people require a combination of therapies. °Document Released: 05/31/2012 Document Reviewed: 05/31/2012 °ExitCare® Patient Information ©2014 [employer]. ° °

## 2013-03-09 NOTE — Progress Notes (Signed)
Patient ID: [name], female   DOB: 1990-03-12, 23 y.o.   MRN: [id number] Subjective:    Stacey HuhJennifer L Guilford is a 23 y.o. 592P2002 Caucasian female who presents for a postpartum visit. She is 4 weeks postpartum following a primary cesarean section, low transverse incision at 38.5 gestational weeks d/t failure to descend/failed vacuum after >1hr of pushing. Baby was OP. Anesthesia: epidural. I have fully reviewed the prenatal and intrapartum course. Postpartum course has been complicated by depression/anxiety. Baby's course has been uncomplicated. Baby is feeding by breast x 2wks, now bottle. Bleeding no bleeding. Bowel function is normal. Bladder function is some stress and urge incontinence. Pt is not sexually active. Attempted sex on 1/15, he wore a condom, barely any contact and she said it was too uncomfortable, so they stopped. Contraception method is desires nexplanon. Understands that it may worsen depression, still wants to try it. Postpartum depression screening: positive. Score 16.  H/O depression/anxiety not related to pregnancy/postpartum, she was on lexparo 10mg  daily during pregnancy, but states she stopped it the last month of pregnancy b/c it wasn't helping. 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 Reports 3-4 panic/anxiety attacks daily, depressed, crying a lot, denies SI/HI/II. Requests depression and anxiety med. Doesn't have mental health provider. Last pap June 2014 and was neg.  The following portions of the patient's history were reviewed and updated as appropriate: allergies, current medications, past medical history, past surgical history and problem list.  Review of Systems Pertinent items are noted in HPI.   Filed Vitals:   03/09/13 1020  BP: 110/70  Weight: 170 lb (77.111 kg)    Objective:     General:  alert, cooperative and no distress   Breasts:  deferred, no complaints  Lungs: clear to auscultation bilaterally  Heart:  regular rate and rhythm  Abdomen:  soft, nontender, incision well-healed   Vulva: normal  Vagina: normal vagina  Cervix:  closed  Corpus: Well-involuted  Adnexa:  Non-palpable  Rectal Exam: No hemorrhoids        Assessment:   Postpartum exam 4 wks s/p PLTCS Depression screening Depression/anxiety Contraception counseling   Plan:   Contraception: desires nexplanon Follow up in: 1 week for nexplanon placement, no sex until after placed Rx lexapro 20mg  daily, ativan 0.5mg  BID prn To seek care if having SI/HI/II  [facility] referral completed, pt to call us if she doesn't hear from them w/in 1wk  Marge DuncansBooker, Jaimie Redditt Randall CNM, [facility]NP-BC 03/09/2013 10:48 AM

## 2013-03-16 ENCOUNTER — Ambulatory Visit (INDEPENDENT_AMBULATORY_CARE_PROVIDER_SITE_OTHER): Payer: Medicaid Other | Admitting: Advanced Practice Midwife

## 2013-03-16 ENCOUNTER — Encounter: Payer: Self-pay | Admitting: Advanced Practice Midwife

## 2013-03-16 VITALS — BP 100/80 | Ht 65.0 in | Wt 165.0 lb

## 2013-03-16 DIAGNOSIS — Z30017 Encounter for initial prescription of implantable subdermal contraceptive: Secondary | ICD-10-CM

## 2013-03-16 DIAGNOSIS — Z3202 Encounter for pregnancy test, result negative: Secondary | ICD-10-CM

## 2013-03-16 LAB — POCT URINE PREGNANCY: PREG TEST UR: NEGATIVE

## 2013-03-16 NOTE — Progress Notes (Signed)
[name] is a [age] y.o. year old Caucasian female here for Nexplanon insertion.  Her LMP was today , and her pregnancy test today was negative.  Risks/benefits/side effects of Nexplanon have been discussed and her questions have been answered.  Specifically, a failure rate of 02/998 has been reported, with an increased failure rate if pt takes St. John's Wort and/or antiseizure medicaitons.  [name] is aware of the common side effect of irregular bleeding, which the incidence of decreases over time.  Her left arm, approximatly 4 inches proximal from the elbow, was cleansed with alcohol and anesthetized with 2cc of 2% Lidocaine.  The area was cleansed again and the Nexplanon was inserted without difficulty.  A pressure bandage was applied.  Pt was instructed to remove pressure bandage in a few hours, and keep insertion site covered with a bandaid for 3 days.  Back up contraception was recommended for none.  Follow-up scheduled PRN problems  [name],Gerardine Peltz 03/16/2013 12:25 PM

## 2013-04-06 ENCOUNTER — Telehealth: Payer: Self-pay | Admitting: Obstetrics and Gynecology

## 2013-04-06 MED ORDER — MEGESTROL ACETATE 40 MG PO TABS: ORAL_TABLET | ORAL | Status: DC

## 2013-04-06 NOTE — Telephone Encounter (Signed)
Megace algorhythm for heavy bleeding 2/2 nexplanon.  Not breastfeeding

## 2013-04-06 NOTE — Telephone Encounter (Signed)
Spoke with pt. Got Nexplanon placed 03/16/13. Has been bleeding since then, heavy. Feels tired all the time. What do you advise? Does she need Megace? [place] [facility] [place]. Thanks!! JSY

## 2013-04-11 ENCOUNTER — Ambulatory Visit (INDEPENDENT_AMBULATORY_CARE_PROVIDER_SITE_OTHER): Payer: Medicaid Other | Admitting: Obstetrics & Gynecology

## 2013-04-11 ENCOUNTER — Encounter: Payer: Self-pay | Admitting: Obstetrics & Gynecology

## 2013-04-11 VITALS — BP 108/80 | Ht 65.0 in | Wt 170.0 lb

## 2013-04-11 DIAGNOSIS — Z3049 Encounter for surveillance of other contraceptives: Secondary | ICD-10-CM

## 2013-04-11 DIAGNOSIS — Z3009 Encounter for other general counseling and advice on contraception: Secondary | ICD-10-CM

## 2013-04-11 NOTE — Progress Notes (Signed)
Patient ID: [name], female   DOB: [birth year], 23 y.o.   MRN: [id number] Pt presented to discuss laparoscopic bilateral tubal ligation She was very reasoned and serious about her decision We talked for a good 15 minutes and I told her studies reveal a significant degree of regret when women younger than 25 and less than 3 kids decide to have permanent sterilization She is making quite a mature argument for her to get one but I regretfully declined on the basis of the above  I told her with her Nexplanon in that I would do it on 06/28/2015 She is going to inquire with Dr Emelda FearFerguson

## 2013-04-12 ENCOUNTER — Encounter: Payer: Medicaid Other | Admitting: Obstetrics and Gynecology

## 2013-04-13 ENCOUNTER — Encounter (HOSPITAL_COMMUNITY): Payer: Self-pay

## 2013-04-13 ENCOUNTER — Encounter: Payer: Self-pay | Admitting: Obstetrics and Gynecology

## 2013-04-13 ENCOUNTER — Ambulatory Visit (INDEPENDENT_AMBULATORY_CARE_PROVIDER_SITE_OTHER): Payer: Medicaid Other | Admitting: Obstetrics and Gynecology

## 2013-04-13 ENCOUNTER — Encounter (HOSPITAL_COMMUNITY)
Admission: RE | Admit: 2013-04-13 | Discharge: 2013-04-13 | Disposition: A | Discharge status: Home / Self Care | Payer: Medicaid Other | Source: Ambulatory Visit | Attending: Obstetrics and Gynecology | Admitting: Obstetrics and Gynecology

## 2013-04-13 ENCOUNTER — Other Ambulatory Visit: Payer: Self-pay | Admitting: Obstetrics and Gynecology

## 2013-04-13 VITALS — BP 122/74 | Ht 65.0 in | Wt 169.4 lb

## 2013-04-13 DIAGNOSIS — Z308 Encounter for other contraceptive management: Secondary | ICD-10-CM

## 2013-04-13 DIAGNOSIS — Z302 Encounter for sterilization: Secondary | ICD-10-CM

## 2013-04-13 DIAGNOSIS — Z01818 Encounter for other preprocedural examination: Secondary | ICD-10-CM

## 2013-04-13 HISTORY — DX: Other specified postprocedural states: Z98.890

## 2013-04-13 HISTORY — DX: Nausea with vomiting, unspecified: R11.2

## 2013-04-13 LAB — BASIC METABOLIC PANEL WITH GFR
BUN: 11 mg/dL (ref 6–23)
CO2: 24 meq/L (ref 19–32)
Calcium: 9.6 mg/dL (ref 8.4–10.5)
Chloride: 104 meq/L (ref 96–112)
Creatinine, Ser: 0.94 mg/dL (ref 0.50–1.10)
GFR calc Af Amer: >90 mL/min
GFR calc non Af Amer: 85 mL/min — ABNORMAL LOW
Glucose, Bld: 99 mg/dL (ref 70–99)
Potassium: 4.5 meq/L (ref 3.7–5.3)
Sodium: 141 meq/L (ref 137–147)

## 2013-04-13 LAB — URINALYSIS, ROUTINE W REFLEX MICROSCOPIC
Bilirubin Urine: NEGATIVE
Glucose, UA: NEGATIVE mg/dL
Ketones, ur: NEGATIVE mg/dL
NITRITE: NEGATIVE
PH: 6 (ref 5.0–8.0)
Protein, ur: NEGATIVE mg/dL
Urobilinogen, UA: 0.2 mg/dL (ref 0.0–1.0)

## 2013-04-13 LAB — URINE MICROSCOPIC-ADD ON

## 2013-04-13 LAB — HCG, SERUM, QUALITATIVE: Preg, Serum: NEGATIVE

## 2013-04-13 LAB — CBC
HEMATOCRIT: 36.8 % (ref 36.0–46.0)
HEMOGLOBIN: 12.6 g/dL (ref 12.0–15.0)
MCH: 25.7 pg — ABNORMAL LOW (ref 26.0–34.0)
MCHC: 34.2 g/dL (ref 30.0–36.0)
MCV: 74.9 fL — ABNORMAL LOW (ref 78.0–100.0)
Platelets: 312 10*3/uL (ref 150–400)
RBC: 4.91 MIL/uL (ref 3.87–5.11)
RDW: 14.8 % (ref 11.5–15.5)
WBC: 7.3 10*3/uL (ref 4.0–10.5)

## 2013-04-13 NOTE — Patient Instructions (Addendum)
[facility] at [facility] for [facility]  Salpingectomy Salpingectomy, also called tubectomy, is the surgical removal of one of the fallopian tubes. The fallopian tubes are tubes that are connected to the uterus. These tubes transport the egg from the ovary to the uterus. A salpingectomy may be done for various reasons, including:   A tubal (ectopic) pregnancy. This is especially true if the tube ruptures.  An infected fallopian tube.  The need to remove the fallopian tube when removing an ovary with a cyst or tumor.  The need to remove the fallopian tube when removing the uterus.  Cancer of the fallopian tube or nearby organs. Removing one fallopian tube does not prevent you from becoming pregnant. It also does not cause problems with your menstrual periods.  [facility] KNOW ABOUT:  Any allergies you have.  All medicines you are taking, including vitamins, herbs, eye drops, creams, and over-the-counter medicines.  Previous problems you or members of your family have had with the use of anesthetics.  Any blood disorders you have.  Previous surgeries you have had.  Medical conditions you have. RISKS AND COMPLICATIONS  Generally, this is a safe procedure. However, as with any procedure, complications can occur. Possible complications include:  Injury to surrounding organs.  Bleeding.  Infection.  Problems related to anesthesia. BEFORE THE PROCEDURE  Ask your health care provider about changing or stopping your regular medicines. You may need to stop taking certain medicines, such as aspirin or blood thinners, at least 1 week before the surgery.  Do not eat or drink anything for at least 8 hours before the surgery.  If you smoke, do not smoke for at least 2 weeks before the surgery.  Make plans to have someone drive you home after the procedure or after your hospital stay. Also arrange for someone to help you with activities during  recovery. PROCEDURE   You will be given medicine to help you relax before the procedure (sedative). You will then be given medicine to make you sleep through the procedure (general anesthetic). These medicines will be given through an IV access tube that is put into one of your veins.  Once you are asleep, your lower abdomen will be shaved and cleaned. A thin, flexible tube (catheter) will be placed in your bladder.  The surgeon may use a laparoscopic, robotic, or open technique for this surgery:  In the laparoscopic technique, the surgery is done through two small cuts (incisions) in the abdomen. A thin, lighted tube with a tiny camera on the end (laparoscope) is inserted into one of the incisions. The tools needed for the procedure are put through the other incision.  A robotic technique may be chosen to perform complex surgery in a small space. In the robotic technique, small incisions will be made. A camera and surgical instruments are passed through the incisions. Surgical instruments will be controlled with the help of a robotic arm.  In the open technique, the surgery is done through one large incision in the abdomen.  Using any of these techniques, the surgeon removes the fallopian tube from where it attaches to the uterus. The blood vessels will be clamped and tied.  The surgeon then uses staples or stitches to close the incision or incisions. AFTER THE PROCEDURE   You will be taken to a recovery area where your progress will be monitored for 1 3 hours.  If the laparoscopic technique was used, you may be allowed to go  home after several hours. You may have some shoulder pain after the laparoscopic procedure. This is normal and usually goes away in a day or two.  If the open technique was used, you will be admitted to the hospital for a couple of days.  You will be given pain medicine if needed.  The IV access tube and catheter will be removed before you are discharged. Document  Released: 06/22/2008 Document Revised: 11/24/2012 Document Reviewed: 07/28/2012 [facility] Patient Information 2014 [place], [place].

## 2013-04-13 NOTE — Patient Instructions (Signed)
[name]  04/13/2013   Your procedure is scheduled on:  04/15/13  Report to [facility] at 06:15 AM.  Call this number if you have problems the morning of surgery: [phone]   Remember:   Do not eat food or drink liquids after midnight.   Take these medicines the morning of surgery with A SIP OF WATER: Lexapro. You may take your Ativan if needed.   Do not wear jewelry, make-up or nail polish.  Do not wear lotions, powders, or perfumes.   Do not shave 48 hours prior to surgery. Men may shave face and neck.  Do not bring valuables to the hospital.  [facility] is not responsible for any belongings or valuables.               Contacts, dentures or bridgework may not be worn into surgery.  Leave suitcase in the car. After surgery it may be brought to your room.  For patients admitted to the hospital, discharge time is determined by your treatment team.               Patients discharged the day of surgery will not be allowed to drive home.    Special Instructions: Shower using CHG 2 nights before surgery and the night before surgery.  If you shower the day of surgery use CHG.  Use special wash - you have one bottle of CHG for all showers.  You should use approximately 1/3 of the bottle for each shower.   Please read over the following fact sheets that you were given: Pain Booklet, Anesthesia Post-op Instructions and Care and Recovery After Surgery    Salpingectomy Salpingectomy, also called tubectomy, is the surgical removal of one of the fallopian tubes. The fallopian tubes are tubes that are connected to the uterus. These tubes transport the egg from the ovary to the uterus. A salpingectomy may be done for various reasons, including:   A tubal (ectopic) pregnancy. This is especially true if the tube ruptures.  An infected fallopian tube.  The need to remove the fallopian tube when removing an ovary with a cyst or tumor.  The need to remove the fallopian tube when  removing the uterus.  Cancer of the fallopian tube or nearby organs. Removing one fallopian tube does not prevent you from becoming pregnant. It also does not cause problems with your menstrual periods.  LET [facility] CARE PROVIDER KNOW ABOUT:  Any allergies you have.  All medicines you are taking, including vitamins, herbs, eye drops, creams, and over-the-counter medicines.  Previous problems you or members of your family have had with the use of anesthetics.  Any blood disorders you have.  Previous surgeries you have had.  Medical conditions you have. RISKS AND COMPLICATIONS  Generally, this is a safe procedure. However, as with any procedure, complications can occur. Possible complications include:  Injury to surrounding organs.  Bleeding.  Infection.  Problems related to anesthesia. BEFORE THE PROCEDURE  Ask your health care provider about changing or stopping your regular medicines. You may need to stop taking certain medicines, such as aspirin or blood thinners, at least 1 week before the surgery.  Do not eat or drink anything for at least 8 hours before the surgery.  If you smoke, do not smoke for at least 2 weeks before the surgery.  Make plans to have someone drive you home after the procedure or after your hospital stay. Also arrange for someone to help you with activities during  recovery. PROCEDURE   You will be given medicine to help you relax before the procedure (sedative). You will then be given medicine to make you sleep through the procedure (general anesthetic). These medicines will be given through an IV access tube that is put into one of your veins.  Once you are asleep, your lower abdomen will be shaved and cleaned. A thin, flexible tube (catheter) will be placed in your bladder.  The surgeon may use a laparoscopic, robotic, or open technique for this surgery:  In the laparoscopic technique, the surgery is done through two small cuts (incisions) in  the abdomen. A thin, lighted tube with a tiny camera on the end (laparoscope) is inserted into one of the incisions. The tools needed for the procedure are put through the other incision.  A robotic technique may be chosen to perform complex surgery in a small space. In the robotic technique, small incisions will be made. A camera and surgical instruments are passed through the incisions. Surgical instruments will be controlled with the help of a robotic arm.  In the open technique, the surgery is done through one large incision in the abdomen.  Using any of these techniques, the surgeon removes the fallopian tube from where it attaches to the uterus. The blood vessels will be clamped and tied.  The surgeon then uses staples or stitches to close the incision or incisions. AFTER THE PROCEDURE   You will be taken to a recovery area where your progress will be monitored for 1 3 hours.  If the laparoscopic technique was used, you may be allowed to go home after several hours. You may have some shoulder pain after the laparoscopic procedure. This is normal and usually goes away in a day or two.  If the open technique was used, you will be admitted to the hospital for a couple of days.  You will be given pain medicine if needed.  The IV access tube and catheter will be removed before you are discharged. Document Released: 06/22/2008 Document Revised: 11/24/2012 Document Reviewed: 07/28/2012 [facility] Patient Information 2014 [place], [place].    Sterilization Information, Female Female sterilization is a procedure to permanently prevent pregnancy. There are different ways to perform sterilization, but all either block or close the fallopian tubes so that your eggs cannot reach your uterus. If your egg cannot reach your uterus, sperm cannot fertilize the egg, and you cannot get pregnant.  Sterilization is performed by a surgical procedure. Sometimes these procedures are performed in a hospital  while a patient is asleep. Sometimes they can be done in a clinic setting with the patient awake. The fallopian tubes can be surgically cut, tied, or sealed through a procedure called tubal ligation. The fallopian tubes can also be closed with clips or rings. Sterilization can also be done by placing a tiny coil into each fallopian tube, which causes scar tissue to grow inside the tube. The scar tissue then blocks the tubes.  Discuss sterilization with your caregiver to answer any concerns you or your partner may have. You may want to ask what type of sterilization your caregiver performs. Some caregivers may not perform all the various options. Sterilization is permanent and should only be done if you are sure you do not want children or do not want any more children. Having a sterilization reversed may not be successful.  STERILIZATION PROCEDURES  Laparoscopic sterilization. This is a surgical method performed at a time other than right after childbirth. Two incisions are made  in the lower abdomen. A thin, lighted tube (laparoscope) is inserted into one of the incisions and is used to perform the procedure. The fallopian tubes are closed with a ring or a clip. An instrument that uses heat could be used to seal the tubes closed (electrocautery).   Mini-laparotomy. This is a surgical method done 1 or 2 days after giving birth. Typically, a small incision is made just below the belly button (umbilicus) and the fallopian tubes are exposed. The tubes can then be sealed, tied, or cut.   Hysteroscopic sterilization. This is performed at a time other than right after childbirth. A tiny, spring-like coil is inserted through the cervix and uterus and placed into the fallopian tubes. The coil causes scaring and blocks the tubes. Other forms of contraception should be used for 3 months after the procedure to allow the scar tissue to form completely. Additionally, it is required hysterosalpingography be done 3 months  later to ensure that the procedure was successful. Hysterosalpingography is a procedure that uses X-rays to look at your uterus and fallopian tubes after a material to make them show up better has been inserted. IS STERILIZATION SAFE? Sterilization is considered safe with very rare complications. Risks depend on the type of procedure you have. As with any surgical procedure, there are risks. Some risks of sterilization by any means include:   Bleeding.  Infection.  Reaction to anesthesia medicine.  Injury to surrounding organs. Risks specific to having hysteroscopic coils placed include:  The coils may not be placed correctly the first time.   The coils may move out of place.   The tubes may not get completely blocked after 3 months.   Injury to surrounding organs when placing the coil.  HOW EFFECTIVE IS FEMALE STERILIZATION? Sterilization is nearly 100% effective, but it can fail. Depending on the type of sterilization, the rate of failure can be as high as 3%. After hysteroscopic sterilization with placement of fallopian tube coils, you will need back-up birth control for 3 months after the procedure. Sterilization is effective for a lifetime.  BENEFITS OF STERILIZATION  It does not affect your hormones, and therefore will not affect your menstrual periods, sexual desire, or performance.   It is effective for a lifetime.   It is safe.   You do not need to worry about getting pregnant. Keep in mind that if you had the hysteroscopic placement procedure, you must wait 3 months after the procedure (or until your caregiver confirms) before pregnancy is not considered possible.   There are no side effects unlike other types of birth control (contraception).  DRAWBACKS OF STERILIZATION  You must be sure you do not want children or any more children. The procedure is permanent.   It does not provide protection against sexually transmitted infections (STIs).   The tubes  can grow back together. If this happens, there is a risk of pregnancy. There is also an increased risk (50%) of pregnancy being an ectopic pregnancy. This is a pregnancy that happens outside of the uterus. Document Released: 07/23/2007 Document Revised: 08/05/2011 Document Reviewed: 05/22/2011 [facility] Patient Information 2014 [place].    PATIENT INSTRUCTIONS POST-ANESTHESIA  IMMEDIATELY FOLLOWING SURGERY:  Do not drive or operate machinery for the first twenty four hours after surgery.  Do not make any important decisions for twenty four hours after surgery or while taking narcotic pain medications or sedatives.  If you develop intractable nausea and vomiting or a severe headache please notify your doctor immediately.  FOLLOW-UP:  Please make an appointment with your surgeon as instructed. You do not need to follow up with anesthesia unless specifically instructed to do so.  WOUND CARE INSTRUCTIONS (if applicable):  Keep a dry clean dressing on the anesthesia/puncture wound site if there is drainage.  Once the wound has quit draining you may leave it open to air.  Generally you should leave the bandage intact for twenty four hours unless there is drainage.  If the epidural site drains for more than 36-48 hours please call the anesthesia department.  QUESTIONS?:  Please feel free to call your physician or the hospital operator if you have any questions, and they will be happy to assist you.

## 2013-04-13 NOTE — Progress Notes (Signed)
This chart was scribed by Bennett Scrapehristina Taylor, Medical Scribe, for Dr. Christin BachJohn Keaundre Thelin on 04/13/13 at 10:27 AM. This chart was reviewed by Dr. Christin BachJohn Castin Donaghue and is accurate.    [facility] Visit  Patient name: [name] MRN [id number]  Date of birth: 27-Sep-1990  CC & HPI:  [name] is a 23 y.o. female presenting today for BLT discussion. G2P2. Signed BLT during last pregnancy but postponed due to counseling. Last pregnancy was not planned, skipped a pill and became pregnant. Got Implanon after counseling on 03/16/13 but still wants BLT. Reports close with partner who is father of 2 children and does not want to have any more children. She has no insurance.  ROS:  Denies any symptoms.   Pertinent History Reviewed:  Medical & Surgical Hx:  Reviewed: Significant for HTN and mental disorder Medications: Reviewed & Updated - see associated section Social History: Reviewed -  reports that she has quit smoking. Her smoking use included Cigarettes. She smoked 0.00 packs per day. She has never used smokeless tobacco.  Objective Findings:  Vitals: BP 122/74  Ht 5\' 5"  (1.651 m)  Wt 169 lb 6.4 oz (76.839 kg)  BMI 28.19 kg/m2  LMP 03/16/2013  Physical Examination: Chest - clear to auscultation, no wheezes, rales or rhonchi, symmetric air entry, no tachypnea, retractions or cyanosis Heart - normal rate, regular rhythm, normal S1, S2, no murmurs, rubs, clicks or gallops General appearance - alert, well appearing, and in no distress and oriented to person, place, and time Mental status - alert, oriented to person, place, and time, normal mood, behavior, speech, dress, motor activity, and thought processes Chaperone present for exam. Exam performed with pt's permission without complications or severe discomfort Pelvic - VULVA: normal appearing vulva with no masses, tenderness or lesions, VAGINA: normal appearing vagina with normal color and discharge, no lesions, CERVIX:  normal appearing cervix without discharge or lesions, UTERUS: uterus is normal size, shape, consistency and nontender, retroverted   Assessment & Plan:  Advised pt that with Implanon she is protected from pregnancy for the next 3 years and encouraged pt to sign up for Obamacare. Pt reports that she still wants BLT after counseling. Pros and Cons of clips versus salpingectomy discussed. Pt decided on salpingectomy  P: Sterilization by bilateral salpingectomy on 04/15/13 at 7:30 AM

## 2013-04-13 NOTE — H&P (Signed)
  This chart was scribed by Christina Taylor, Medical Scribe, for Dr. Malvin Morrish on 04/13/13 at 10:27 AM. This chart was reviewed by Dr. Hinata Diener and is accurate.  [facility] Visit   Patient name: [name] MRN [id number] Date of birth: 06/28/1990  CC & HPI:   [name] is a 23 y.o. female presenting today for BLT discussion. G2P2. Signed BLT during last pregnancy but postponed due to counseling. Last pregnancy was not planned, skipped a pill and became pregnant. Got Implanon after counseling on 03/16/13 but still wants BLT. Reports close with partner who is father of 2 children and does not want to have any more children. She has no insurance.  ROS:   Denies any symptoms.  Pertinent History Reviewed:   Medical & Surgical Hx: Reviewed: Significant for HTN and mental disorder  Medications: Reviewed & Updated - see associated section  Social History: Reviewed - reports that she has quit smoking. Her smoking use included Cigarettes. She smoked 0.00 packs per day. She has never used smokeless tobacco.  Objective Findings:   Vitals: BP 122/74  Ht 5' 5" (1.651 m)  Wt 169 lb 6.4 oz (76.839 kg)  BMI 28.19 kg/m2  LMP 03/16/2013  Physical Examination: Chest - clear to auscultation, no wheezes, rales or rhonchi, symmetric air entry, no tachypnea, retractions or cyanosis  Heart - normal rate, regular rhythm, normal S1, S2, no murmurs, rubs, clicks or gallops  General appearance - alert, well appearing, and in no distress and oriented to person, place, and time  Mental status - alert, oriented to person, place, and time, normal mood, behavior, speech, dress, motor activity, and thought processes  Chaperone present for exam. Exam performed with pt's permission without complications or severe discomfort  Pelvic - VULVA: normal appearing vulva with no masses, tenderness or lesions, VAGINA: normal appearing vagina with normal color and discharge, no lesions, CERVIX:  normal appearing cervix without discharge or lesions, UTERUS: uterus is normal size, shape, consistency and nontender, retroverted  Assessment & Plan:   Advised pt that with Implanon she is protected from pregnancy for the next 3 years and encouraged pt to sign up for Obamacare. Pt reports that she still wants BLT after counseling. Pros and Cons of clips versus salpingectomy discussed. Pt decided on salpingectomy  P: Sterilization by bilateral salpingectomy on 04/15/13 at 7:30 AM        Will also remove the Nexplanon at the time of the  Bilateral Salpingectomy    

## 2013-04-14 ENCOUNTER — Encounter (HOSPITAL_COMMUNITY): Payer: Self-pay

## 2013-04-15 ENCOUNTER — Encounter (HOSPITAL_COMMUNITY)
Admission: RE | Disposition: A | Discharge status: Home / Self Care | Payer: Self-pay | Source: Ambulatory Visit | Attending: Obstetrics and Gynecology

## 2013-04-15 ENCOUNTER — Encounter (HOSPITAL_COMMUNITY): Payer: Medicaid Other | Admitting: Anesthesiology

## 2013-04-15 ENCOUNTER — Ambulatory Visit (HOSPITAL_COMMUNITY): Payer: Medicaid Other | Admitting: Anesthesiology

## 2013-04-15 ENCOUNTER — Ambulatory Visit (HOSPITAL_COMMUNITY)
Admission: RE | Admit: 2013-04-15 | Discharge: 2013-04-15 | Disposition: A | Discharge status: Home / Self Care | Payer: Medicaid Other | Source: Ambulatory Visit | Attending: Obstetrics and Gynecology | Admitting: Obstetrics and Gynecology

## 2013-04-15 DIAGNOSIS — F3289 Other specified depressive episodes: Secondary | ICD-10-CM | POA: Insufficient documentation

## 2013-04-15 DIAGNOSIS — Z309 Encounter for contraceptive management, unspecified: Secondary | ICD-10-CM

## 2013-04-15 DIAGNOSIS — I10 Essential (primary) hypertension: Secondary | ICD-10-CM | POA: Insufficient documentation

## 2013-04-15 DIAGNOSIS — Z3046 Encounter for surveillance of implantable subdermal contraceptive: Secondary | ICD-10-CM | POA: Insufficient documentation

## 2013-04-15 DIAGNOSIS — Z79899 Other long term (current) drug therapy: Secondary | ICD-10-CM | POA: Insufficient documentation

## 2013-04-15 DIAGNOSIS — Z302 Encounter for sterilization: Secondary | ICD-10-CM

## 2013-04-15 DIAGNOSIS — Z87891 Personal history of nicotine dependence: Secondary | ICD-10-CM | POA: Insufficient documentation

## 2013-04-15 DIAGNOSIS — F329 Major depressive disorder, single episode, unspecified: Secondary | ICD-10-CM | POA: Insufficient documentation

## 2013-04-15 DIAGNOSIS — Z6828 Body mass index (BMI) 28.0-28.9, adult: Secondary | ICD-10-CM | POA: Insufficient documentation

## 2013-04-15 HISTORY — PX: REMOVAL OF NON VAGINAL CONTRACEPTIVE DEVICE: SHX6219

## 2013-04-15 HISTORY — PX: LAPAROSCOPIC BILATERAL SALPINGECTOMY: SHX5889

## 2013-04-15 SURGERY — SALPINGECTOMY, BILATERAL, LAPAROSCOPIC
Anesthesia: General | Site: Arm Upper | Laterality: Left

## 2013-04-15 MED ORDER — PROPOFOL 10 MG/ML IV BOLUS
INTRAVENOUS | Status: AC
  Filled 2013-04-15: qty 20

## 2013-04-15 MED ORDER — FENTANYL CITRATE 0.05 MG/ML IJ SOLN
INTRAMUSCULAR | Status: AC
  Filled 2013-04-15: qty 5

## 2013-04-15 MED ORDER — SODIUM CHLORIDE 0.9 % IJ SOLN
INTRAMUSCULAR | Status: AC
  Filled 2013-04-15: qty 10

## 2013-04-15 MED ORDER — LIDOCAINE HCL 1 % IJ SOLN
INTRAMUSCULAR | Status: DC | PRN
  Administered 2013-04-15: 40 mg via INTRADERMAL

## 2013-04-15 MED ORDER — 0.9 % SODIUM CHLORIDE (POUR BTL) OPTIME
TOPICAL | Status: DC | PRN
  Administered 2013-04-15: 1000 mL

## 2013-04-15 MED ORDER — FENTANYL CITRATE 0.05 MG/ML IJ SOLN
INTRAMUSCULAR | Status: AC
  Filled 2013-04-15: qty 2

## 2013-04-15 MED ORDER — GLYCOPYRROLATE 0.2 MG/ML IJ SOLN
INTRAMUSCULAR | Status: AC
  Filled 2013-04-15: qty 4

## 2013-04-15 MED ORDER — DEXAMETHASONE SODIUM PHOSPHATE 4 MG/ML IJ SOLN
INTRAMUSCULAR | Status: AC
  Filled 2013-04-15: qty 1

## 2013-04-15 MED ORDER — GLYCOPYRROLATE 0.2 MG/ML IJ SOLN
INTRAMUSCULAR | Status: DC | PRN
  Administered 2013-04-15: .8 mg via INTRAVENOUS

## 2013-04-15 MED ORDER — FENTANYL CITRATE 0.05 MG/ML IJ SOLN
25.0000 ug | INTRAMUSCULAR | Status: DC | PRN
  Administered 2013-04-15: 50 ug via INTRAVENOUS
  Administered 2013-04-15: 25 ug via INTRAVENOUS
  Administered 2013-04-15 (×3): 50 ug via INTRAVENOUS
  Filled 2013-04-15: qty 2

## 2013-04-15 MED ORDER — LIDOCAINE HCL (PF) 1 % IJ SOLN
INTRAMUSCULAR | Status: AC
  Filled 2013-04-15: qty 5

## 2013-04-15 MED ORDER — NEOSTIGMINE METHYLSULFATE 1 MG/ML IJ SOLN
INTRAMUSCULAR | Status: AC
  Filled 2013-04-15: qty 1

## 2013-04-15 MED ORDER — PROPOFOL 10 MG/ML IV BOLUS
INTRAVENOUS | Status: DC | PRN
  Administered 2013-04-15: 200 mg via INTRAVENOUS

## 2013-04-15 MED ORDER — PROMETHAZINE HCL 25 MG/ML IJ SOLN
INTRAMUSCULAR | Status: AC
  Filled 2013-04-15: qty 1

## 2013-04-15 MED ORDER — FENTANYL CITRATE 0.05 MG/ML IJ SOLN
INTRAMUSCULAR | Status: DC | PRN
Start: 2013-04-15 — End: 2013-04-15
  Administered 2013-04-15: 100 ug via INTRAVENOUS
  Administered 2013-04-15: 150 ug via INTRAVENOUS

## 2013-04-15 MED ORDER — GLYCOPYRROLATE 0.2 MG/ML IJ SOLN
0.2000 mg | Freq: Once | INTRAMUSCULAR | Status: AC
  Administered 2013-04-15: 0.2 mg via INTRAVENOUS

## 2013-04-15 MED ORDER — ROCURONIUM BROMIDE 100 MG/10ML IV SOLN
INTRAVENOUS | Status: DC | PRN
  Administered 2013-04-15: 50 mg via INTRAVENOUS

## 2013-04-15 MED ORDER — KETOROLAC TROMETHAMINE 30 MG/ML IJ SOLN
30.0000 mg | Freq: Once | INTRAMUSCULAR | Status: AC
  Administered 2013-04-15: 30 mg via INTRAVENOUS

## 2013-04-15 MED ORDER — BUPIVACAINE HCL (PF) 0.5 % IJ SOLN
INTRAMUSCULAR | Status: AC
  Filled 2013-04-15: qty 30

## 2013-04-15 MED ORDER — KETOROLAC TROMETHAMINE 30 MG/ML IJ SOLN
INTRAMUSCULAR | Status: AC
  Filled 2013-04-15: qty 1

## 2013-04-15 MED ORDER — ONDANSETRON HCL 4 MG/2ML IJ SOLN: 4.0000 mg | Freq: Once | INTRAMUSCULAR | Status: DC | PRN

## 2013-04-15 MED ORDER — ONDANSETRON HCL 4 MG/2ML IJ SOLN
INTRAMUSCULAR | Status: AC
  Filled 2013-04-15: qty 2

## 2013-04-15 MED ORDER — BUPIVACAINE HCL (PF) 0.5 % IJ SOLN
INTRAMUSCULAR | Status: DC | PRN
  Administered 2013-04-15: 2 mL
  Administered 2013-04-15: 10 mL

## 2013-04-15 MED ORDER — GLYCOPYRROLATE 0.2 MG/ML IJ SOLN
INTRAMUSCULAR | Status: AC
  Filled 2013-04-15: qty 1

## 2013-04-15 MED ORDER — DEXAMETHASONE SODIUM PHOSPHATE 4 MG/ML IJ SOLN
4.0000 mg | Freq: Once | INTRAMUSCULAR | Status: AC
  Administered 2013-04-15: 4 mg via INTRAVENOUS

## 2013-04-15 MED ORDER — NEOSTIGMINE METHYLSULFATE 1 MG/ML IJ SOLN
INTRAMUSCULAR | Status: DC | PRN
  Administered 2013-04-15: 5 mg via INTRAVENOUS

## 2013-04-15 MED ORDER — ROCURONIUM BROMIDE 50 MG/5ML IV SOLN
INTRAVENOUS | Status: AC
  Filled 2013-04-15: qty 1

## 2013-04-15 MED ORDER — ONDANSETRON HCL 4 MG/2ML IJ SOLN
INTRAMUSCULAR | Status: DC | PRN
  Administered 2013-04-15: 4 mg via INTRAVENOUS

## 2013-04-15 MED ORDER — OXYCODONE-ACETAMINOPHEN 5-325 MG PO TABS: 1.0000 | ORAL_TABLET | ORAL | Status: DC | PRN

## 2013-04-15 MED ORDER — PROMETHAZINE HCL 25 MG/ML IJ SOLN
12.5000 mg | Freq: Once | INTRAMUSCULAR | Status: AC
  Administered 2013-04-15: 12.5 mg via INTRAVENOUS

## 2013-04-15 MED ORDER — ONDANSETRON HCL 4 MG/2ML IJ SOLN
4.0000 mg | Freq: Once | INTRAMUSCULAR | Status: AC
  Administered 2013-04-15: 4 mg via INTRAVENOUS

## 2013-04-15 MED ORDER — MIDAZOLAM HCL 2 MG/2ML IJ SOLN
1.0000 mg | INTRAMUSCULAR | Status: DC | PRN
  Administered 2013-04-15 (×2): 1 mg via INTRAVENOUS

## 2013-04-15 MED ORDER — MIDAZOLAM HCL 2 MG/2ML IJ SOLN
INTRAMUSCULAR | Status: AC
  Filled 2013-04-15: qty 2

## 2013-04-15 MED ORDER — LACTATED RINGERS IV SOLN
INTRAVENOUS | Status: DC
  Administered 2013-04-15: 08:00:00 via INTRAVENOUS
  Administered 2013-04-15: 1000 mL via INTRAVENOUS

## 2013-04-15 SURGICAL SUPPLY — 53 items
BAG HAMPER (MISCELLANEOUS) ×4 IMPLANT
BAG SPEC RTRVL LRG 6X4 10 (ENDOMECHANICALS)
BANDAGE STRIP 1X3 FLEXIBLE (GAUZE/BANDAGES/DRESSINGS) ×12 IMPLANT
BLADE SURG SZ11 CARB STEEL (BLADE) ×4 IMPLANT
CLOSURE WOUND 1/2 X4 (GAUZE/BANDAGES/DRESSINGS) ×2
CLOTH BEACON ORANGE TIMEOUT ST (SAFETY) ×4 IMPLANT
COVER LIGHT HANDLE STERIS (MISCELLANEOUS) ×8 IMPLANT
DECANTER SPIKE VIAL GLASS SM (MISCELLANEOUS) ×4 IMPLANT
DURAPREP 26ML APPLICATOR (WOUND CARE) ×4 IMPLANT
ELECT REM PT RETURN 9FT ADLT (ELECTROSURGICAL) ×4
ELECTRODE REM PT RTRN 9FT ADLT (ELECTROSURGICAL) ×2 IMPLANT
FILTER SMOKE EVAC LAPAROSHD (FILTER) ×4 IMPLANT
GLOVE BIOGEL PI IND STRL 7.0 (GLOVE) IMPLANT
GLOVE BIOGEL PI IND STRL 7.5 (GLOVE) IMPLANT
GLOVE BIOGEL PI IND STRL 9 (GLOVE) ×4 IMPLANT
GLOVE BIOGEL PI INDICATOR 7.0 (GLOVE) ×2
GLOVE BIOGEL PI INDICATOR 7.5 (GLOVE) ×4
GLOVE BIOGEL PI INDICATOR 9 (GLOVE) ×4
GLOVE ECLIPSE 6.5 STRL STRAW (GLOVE) ×2 IMPLANT
GLOVE ECLIPSE 9.0 STRL (GLOVE) ×4 IMPLANT
GLOVE EXAM NITRILE PF MED BLUE (GLOVE) ×2 IMPLANT
GOWN SPEC L3 XXLG W/TWL (GOWN DISPOSABLE) ×6 IMPLANT
GOWN STRL REUS W/TWL LRG LVL3 (GOWN DISPOSABLE) ×4 IMPLANT
INST SET LAPROSCOPIC GYN AP (KITS) ×4 IMPLANT
KIT ROOM TURNOVER APOR (KITS) ×4 IMPLANT
LIGASURE 5MM LAPAROSCOPIC (INSTRUMENTS) ×2 IMPLANT
NDL HYPO 25X1 1.5 SAFETY (NEEDLE) ×2 IMPLANT
NEEDLE HYPO 25X1 1.5 SAFETY (NEEDLE) ×4 IMPLANT
NEEDLE INSUFFLATION 120MM (ENDOMECHANICALS) ×4 IMPLANT
NS IRRIG 1000ML POUR BTL (IV SOLUTION) ×4 IMPLANT
PACK PERI GYN (CUSTOM PROCEDURE TRAY) ×4 IMPLANT
PAD ARMBOARD 7.5X6 YLW CONV (MISCELLANEOUS) ×4 IMPLANT
POUCH SPECIMEN RETRIEVAL 10MM (ENDOMECHANICALS) IMPLANT
SCALPEL HARMONIC ACE (MISCELLANEOUS) IMPLANT
SET BASIN LINEN APH (SET/KITS/TRAYS/PACK) ×4 IMPLANT
SET IRRIG TUBING LAPAROSCOPIC (IRRIGATION / IRRIGATOR) IMPLANT
SOLUTION ANTI FOG 6CC (MISCELLANEOUS) ×4 IMPLANT
SPONGE GAUZE 2X2 8PLY STER LF (GAUZE/BANDAGES/DRESSINGS) ×1
SPONGE GAUZE 2X2 8PLY STRL LF (GAUZE/BANDAGES/DRESSINGS) ×1 IMPLANT
STRIP CLOSURE SKIN 1/2X4 (GAUZE/BANDAGES/DRESSINGS) ×4 IMPLANT
SUT VIC AB 4-0 PS2 27 (SUTURE) ×4 IMPLANT
SUT VICRYL 0 UR6 27IN ABS (SUTURE) ×4 IMPLANT
SYR BULB IRRIGATION 50ML (SYRINGE) ×4 IMPLANT
SYR CONTROL 10ML LL (SYRINGE) ×4 IMPLANT
SYRINGE 10CC LL (SYRINGE) ×6 IMPLANT
TAPE CLOTH SURG 4X10 WHT LF (GAUZE/BANDAGES/DRESSINGS) ×2 IMPLANT
TRAY FOLEY CATH 16FR SILVER (SET/KITS/TRAYS/PACK) ×2 IMPLANT
TROCAR ENDO BLADELESS 11MM (ENDOMECHANICALS) ×4 IMPLANT
TROCAR XCEL NON-BLD 5MMX100MML (ENDOMECHANICALS) ×4 IMPLANT
TROCAR XCEL UNIV SLVE 11M 100M (ENDOMECHANICALS) ×4 IMPLANT
TUBING INSUFFLATION (TUBING) ×4 IMPLANT
WARMER LAPAROSCOPE (MISCELLANEOUS) ×4 IMPLANT
WATER STERILE IRR 1000ML POUR (IV SOLUTION) ×4 IMPLANT

## 2013-04-15 NOTE — Anesthesia Preprocedure Evaluation (Signed)
Anesthesia Evaluation  Patient identified by MRN, date of birth, ID band Patient awake    Reviewed: Allergy & Precautions, H&P , Patient's Chart, lab work & pertinent test results  Airway Mallampati: II TM Distance: >3 FB Neck ROM: full    Dental  (+) Teeth Intact   Pulmonary former smoker,  breath sounds clear to auscultation        Cardiovascular hypertension, Rhythm:regular Rate:Normal     Neuro/Psych    GI/Hepatic   Endo/Other    Renal/GU      Musculoskeletal   Abdominal   Peds  Hematology   Anesthesia Other Findings       Reproductive/Obstetrics (+) Pregnancy                           Anesthesia Physical Anesthesia Plan  ASA: II  Anesthesia Plan: General   Post-op Pain Management:    Induction: Intravenous  Airway Management Planned: Oral ETT  Additional Equipment:   Intra-op Plan:   Post-operative Plan: Extubation in OR  Informed Consent:   Plan Discussed with:   Anesthesia Plan Comments:         Anesthesia Quick Evaluation

## 2013-04-15 NOTE — H&P (View-Only) (Signed)
  This chart was scribed by Bennett Scrapehristina Taylor, Medical Scribe, for Dr. Christin BachJohn Aalaysia Liggins on 04/13/13 at 10:27 AM. This chart was reviewed by Dr. Christin BachJohn Talina Pleitez and is accurate.  [facility] Visit   Patient name: [name] MRN [id number] Date of birth: [birth year]  CC & HPI:   Stacey HuhJennifer L Heringer is a 23 y.o. female presenting today for BLT discussion. G2P2. Signed BLT during last pregnancy but postponed due to counseling. Last pregnancy was not planned, skipped a pill and became pregnant. Got Implanon after counseling on 03/16/13 but still wants BLT. Reports close with partner who is father of 2 children and does not want to have any more children. She has no insurance.  ROS:   Denies any symptoms.  Pertinent History Reviewed:   Medical & Surgical Hx: Reviewed: Significant for HTN and mental disorder  Medications: Reviewed & Updated - see associated section  Social History: Reviewed - reports that she has quit smoking. Her smoking use included Cigarettes. She smoked 0.00 packs per day. She has never used smokeless tobacco.  Objective Findings:   Vitals: BP 122/74  Ht 5\' 5"  (1.651 m)  Wt 169 lb 6.4 oz (76.839 kg)  BMI 28.19 kg/m2  LMP 03/16/2013  Physical Examination: Chest - clear to auscultation, no wheezes, rales or rhonchi, symmetric air entry, no tachypnea, retractions or cyanosis  Heart - normal rate, regular rhythm, normal S1, S2, no murmurs, rubs, clicks or gallops  General appearance - alert, well appearing, and in no distress and oriented to person, place, and time  Mental status - alert, oriented to person, place, and time, normal mood, behavior, speech, dress, motor activity, and thought processes  Chaperone present for exam. Exam performed with pt's permission without complications or severe discomfort  Pelvic - VULVA: normal appearing vulva with no masses, tenderness or lesions, VAGINA: normal appearing vagina with normal color and discharge, no lesions, CERVIX:  normal appearing cervix without discharge or lesions, UTERUS: uterus is normal size, shape, consistency and nontender, retroverted  Assessment & Plan:   Advised pt that with Implanon she is protected from pregnancy for the next 3 years and encouraged pt to sign up for Obamacare. Pt reports that she still wants BLT after counseling. Pros and Cons of clips versus salpingectomy discussed. Pt decided on salpingectomy  P: Sterilization by bilateral salpingectomy on 04/15/13 at 7:30 AM        Will also remove the Nexplanon at the time of the  Bilateral Salpingectomy

## 2013-04-15 NOTE — Anesthesia Postprocedure Evaluation (Signed)
  Anesthesia Post-op Note  Patient: [name]  Procedure(s) Performed: Procedure(s): LAPAROSCOPIC BILATERAL SALPINGECTOMY (Bilateral) REMOVAL OF NON VAGINAL CONTRACEPTIVE DEVICE IMPLANON (Left)  Patient Location: PACU  Anesthesia Type:General  Level of Consciousness: awake, alert  and oriented  Airway and Oxygen Therapy: Patient Spontanous Breathing and Patient connected to face mask oxygen  Post-op Pain: moderate  Post-op Assessment: Post-op Vital signs reviewed, Patient's Cardiovascular Status Stable, Respiratory Function Stable, Patent Airway and No signs of Nausea or vomiting  Post-op Vital Signs: Reviewed and stable  Complications: No apparent anesthesia complications

## 2013-04-15 NOTE — Transfer of Care (Signed)
Immediate Anesthesia Transfer of Care Note  Patient: Stacey HuhJennifer L Corter  Procedure(s) Performed: Procedure(s): LAPAROSCOPIC BILATERAL SALPINGECTOMY (Bilateral) REMOVAL OF NON VAGINAL CONTRACEPTIVE DEVICE IMPLANON (Left)  Patient Location: PACU  Anesthesia Type:General  Level of Consciousness: awake, alert  and oriented  Airway & Oxygen Therapy: Patient Spontanous Breathing and Patient connected to face mask oxygen  Post-op Assessment: Report given to PACU RN and Post -op Vital signs reviewed and stable  Post vital signs: Reviewed and stable  Complications: No apparent anesthesia complications

## 2013-04-15 NOTE — Op Note (Signed)
04/15/2013  8:25 AM  PATIENT:  Stacey HuhJennifer L Mitchell  23 y.o. female  PRE-OPERATIVE DIAGNOSIS:  desires sterilization, permanent  POST-OPERATIVE DIAGNOSIS:  desires sterilization, permanent  PROCEDURE:  Procedure(s): LAPAROSCOPIC BILATERAL SALPINGECTOMY (Bilateral) REMOVAL OF NON VAGINAL CONTRACEPTIVE DEVICE IMPLANON (Left)  SURGEON:  Surgeon(s) and Role:    * Tilda BurrowJohn V Breeze Berringer, MD - Primary  PHYSICIAN ASSISTANT:   ASSISTANTS: none   ANESTHESIA:   local and general  EBL:  Total I/O In: 1000 [I.V.:1000] Out: -   Details of procedure: Periods which was taken operating room prepped and draped for abdominal and vaginal procedure with timeout conducted, Foley catheter in place, and no antibiotics required. Procedure was confirmed by surgical team. Infraumbilical, suprapubic and right lower quadrant once centimeters skin incisions were made, Veress needle used to the umbilicus using water droplet technique confirm intraperitoneal location, and insufflation under 8 mm mercury pressure to 3 L was performed. 10 mm trocar was inserted through the umbilicus under direct visualization, no evidence of bleeding or trauma associated with entry noted. Suprapubic and right lower quadrant 5 mm trochars were inserted under direct visualization, attention directed the pelvis were ligature was used to transect first the left fallopian tube and the right fallopian tube from the adjacent structures. Specimens were extracted through the 5 mm trochars. 120 cc of saline was inserted into the abdomen, abdomen deflated after laparoscopic equipment, and the fascial closure at the umbilicus with 0 Vicryl followed by subcuticular 4-0 Vicryl closure of 3 skin incisions strips were applied and patient then had the left arm prepped, the second procedure confirmed, and the Nexplanon removed from the medial aspects of the left upper arm through a 2 mm incision with the ease. Steri-Strips were applied hemostasis was good local  anesthesia had been injected at all 4 incision sites. Sponge and needle counts correct throughout patient recovery room in stable condition

## 2013-04-15 NOTE — Preoperative (Signed)
Beta Blockers   Reason not to administer Beta Blockers:Not Applicable 

## 2013-04-15 NOTE — Brief Op Note (Signed)
04/15/2013  8:25 AM  PATIENT:  [name]  23 y.o. female  PRE-OPERATIVE DIAGNOSIS:  desires sterilization, permanent  POST-OPERATIVE DIAGNOSIS:  desires sterilization, permanent  PROCEDURE:  Procedure(s): LAPAROSCOPIC BILATERAL SALPINGECTOMY (Bilateral) REMOVAL OF NON VAGINAL CONTRACEPTIVE DEVICE IMPLANON (Left)  SURGEON:  Surgeon(s) and Role:    * Tilda BurrowJohn V Caitlain Tweed, MD - Primary  PHYSICIAN ASSISTANT:   ASSISTANTS: none   ANESTHESIA:   local and general  EBL:  Total I/O In: 1000 [I.V.:1000] Out: -   BLOOD ADMINISTERED:none  DRAINS: none   LOCAL MEDICATIONS USED:  MARCAINE    and Amount: 12 ml  SPECIMEN:  Source of Specimen:  Bilateral fallopian tubes  DISPOSITION OF SPECIMEN:  PATHOLOGY  COUNTS:  YES  TOURNIQUET:  * No tourniquets in log *  DICTATION: .Dragon Dictation  PLAN OF CARE: Discharge to home after PACU  PATIENT DISPOSITION:  PACU - hemodynamically stable.   Delay start of Pharmacological VTE agent (>24hrs) due to surgical blood loss or risk of bleeding: not applicable

## 2013-04-15 NOTE — Interval H&P Note (Signed)
History and Physical Interval Note:  04/15/2013 7:06 AM  [name]  has presented today for surgery, with the diagnosis of desires sterilization  The various methods of treatment have been discussed with the patient and family. After consideration of risks, benefits and other options for treatment, the patient has consented to  Procedure(s): LAPAROSCOPIC BILATERAL SALPINGECTOMY (Bilateral) REMOVAL OF NON VAGINAL CONTRACEPTIVE DEVICE IMPLANON (N/A) as a surgical intervention .  The patient's history has been reviewed, patient examined, no change in status, stable for surgery.  I have reviewed the patient's chart and labs.  Questions were answered to the patient's satisfaction.  She once again confirms this morning that the PERMANENT sterilization is her desire, and she declines long term reversible methods. Left Arm is marked after palpation of nexplanon to be removed in left upper arm.   Tilda BurrowFERGUSON,Aela Bohan V

## 2013-04-15 NOTE — Discharge Instructions (Signed)
Laparoscopic Tubal Ligation °Care After °Refer to this sheet in the next few weeks. These instructions provide you with information on caring for yourself after your procedure. Your caregiver may also give you more specific instructions. Your treatment has been planned according to current medical practices, but problems sometimes occur. Call your caregiver if you have any problems or questions after your procedure. °HOME CARE INSTRUCTIONS  °· Rest the remainder of the day. °· Only take over-the-counter or prescription medicines for pain, discomfort, or fever as directed by your caregiver. Do not take aspirin. It can cause bleeding. °· Gradually resume daily activities, diet, rest, driving, and work. °· Avoid sexual intercourse for 2 weeks or as directed. °· Do not use tampons or douche. °· Do not drive while taking pain medicine. °· Do not lift anything over 5 pounds for 2 weeks or as directed. °· Only take showers, not baths, until you are seen by your caregiver. °· Change bandages (dressings) as directed. °· Take your temperature twice a day and record it. °· Try to have help for the first 7 to 10 days for your household needs. °· Return to your caregiver to get your stitches (sutures) removed and for follow-up visits as directed. °SEEK MEDICAL CARE IF:  °· You have redness, swelling, or increasing pain in a wound. °· You have drainage from a wound lasting longer than 1 day. °· Your pain is getting worse. °· You have a rash. °· You become dizzy or lightheaded. °· You have a reaction to your medicine. °· You need stronger medicine or a change in your pain medicine. °· You notice a bad smell coming from a wound or dressing. °· Your wound breaks open after the sutures have been removed. °· You are constipated. °SEEK IMMEDIATE MEDICAL CARE IF:  °· You faint. °· You have a fever. °· You have increasing abdominal pain. °· You have severe pain in your shoulders. °· You have bleeding or drainage from the suture sites or  vagina following surgery. °· You have shortness of breath or difficulty breathing. °· You have chest or leg pain. °· You have persistent nausea, vomiting, or diarrhea. °MAKE SURE YOU:  °· Understand these instructions. °· Watch your condition. °· Get help right away if you are not doing well or get worse. °Document Released: 08/23/2004 Document Revised: 08/05/2011 Document Reviewed: 05/17/2011 °ExitCare® Patient Information ©2014 [employer]. ° °

## 2013-04-18 ENCOUNTER — Telehealth: Payer: Self-pay | Admitting: Obstetrics and Gynecology

## 2013-04-18 ENCOUNTER — Encounter (HOSPITAL_COMMUNITY): Payer: Self-pay | Admitting: Obstetrics and Gynecology

## 2013-04-18 NOTE — Telephone Encounter (Signed)
Patient had called with complaints of abd pain postop, but this resolved withing 2 days.  Pt now fine

## 2013-04-27 ENCOUNTER — Encounter: Payer: Medicaid Other | Admitting: Obstetrics and Gynecology

## 2013-12-19 ENCOUNTER — Encounter (HOSPITAL_COMMUNITY): Payer: Self-pay | Admitting: Obstetrics and Gynecology

## 2014-11-28 ENCOUNTER — Ambulatory Visit: Payer: Medicaid Other | Admitting: Obstetrics and Gynecology

## 2014-11-28 ENCOUNTER — Encounter: Payer: Self-pay | Admitting: *Deleted

## 2015-09-11 ENCOUNTER — Emergency Department (HOSPITAL_COMMUNITY): Payer: Medicaid Other

## 2015-09-11 ENCOUNTER — Encounter (HOSPITAL_COMMUNITY): Payer: Self-pay

## 2015-09-11 ENCOUNTER — Emergency Department (HOSPITAL_COMMUNITY)
Admission: EM | Admit: 2015-09-11 | Discharge: 2015-09-11 | Disposition: A | Discharge status: Home / Self Care | Payer: Medicaid Other | Attending: Emergency Medicine | Admitting: Emergency Medicine

## 2015-09-11 DIAGNOSIS — F410 Panic disorder [episodic paroxysmal anxiety] without agoraphobia: Secondary | ICD-10-CM | POA: Diagnosis not present

## 2015-09-11 DIAGNOSIS — Z79899 Other long term (current) drug therapy: Secondary | ICD-10-CM | POA: Insufficient documentation

## 2015-09-11 DIAGNOSIS — Z87891 Personal history of nicotine dependence: Secondary | ICD-10-CM | POA: Diagnosis not present

## 2015-09-11 DIAGNOSIS — R110 Nausea: Secondary | ICD-10-CM | POA: Diagnosis present

## 2015-09-11 DIAGNOSIS — M940 Chondrocostal junction syndrome [Tietze]: Secondary | ICD-10-CM | POA: Diagnosis not present

## 2015-09-11 HISTORY — DX: Anxiety disorder, unspecified: F41.9

## 2015-09-11 LAB — COMPREHENSIVE METABOLIC PANEL
ALK PHOS: 68 U/L (ref 38–126)
ALT: 82 U/L — ABNORMAL HIGH (ref 14–54)
ANION GAP: 6 (ref 5–15)
AST: 26 U/L (ref 15–41)
Albumin: 4 g/dL (ref 3.5–5.0)
BILIRUBIN TOTAL: 0.3 mg/dL (ref 0.3–1.2)
BUN: 13 mg/dL (ref 6–20)
CALCIUM: 8.9 mg/dL (ref 8.9–10.3)
CO2: 24 mmol/L (ref 22–32)
Chloride: 109 mmol/L (ref 101–111)
Creatinine, Ser: 0.84 mg/dL (ref 0.44–1.00)
GFR calc Af Amer: >60 mL/min (ref >60)
GFR calc non Af Amer: >60 mL/min (ref >60)
GLUCOSE: 101 mg/dL — AB (ref 65–99)
Potassium: 3.8 mmol/L (ref 3.5–5.1)
Sodium: 139 mmol/L (ref 135–145)
TOTAL PROTEIN: 6.9 g/dL (ref 6.5–8.1)

## 2015-09-11 LAB — CBC WITH DIFFERENTIAL/PLATELET
Basophils Absolute: 0 10*3/uL (ref 0.0–0.1)
Basophils Relative: 0 %
EOS ABS: 0.4 10*3/uL (ref 0.0–0.7)
EOS PCT: 4 %
HEMATOCRIT: 39.5 % (ref 36.0–46.0)
Hemoglobin: 13.8 g/dL (ref 12.0–15.0)
Lymphocytes Relative: 26 %
Lymphs Abs: 2.6 10*3/uL (ref 0.7–4.0)
MCH: 28.5 pg (ref 26.0–34.0)
MCHC: 34.9 g/dL (ref 30.0–36.0)
MCV: 81.4 fL (ref 78.0–100.0)
MONO ABS: 0.8 10*3/uL (ref 0.1–1.0)
MONOS PCT: 8 %
NEUTROS ABS: 6 10*3/uL (ref 1.7–7.7)
Neutrophils Relative %: 62 %
PLATELETS: 242 10*3/uL (ref 150–400)
RBC: 4.85 MIL/uL (ref 3.87–5.11)
RDW: 13.4 % (ref 11.5–15.5)
WBC: 9.8 10*3/uL (ref 4.0–10.5)

## 2015-09-11 LAB — D-DIMER, QUANTITATIVE (NOT AT ARMC)

## 2015-09-11 LAB — TROPONIN I

## 2015-09-11 MED ORDER — NAPROXEN 500 MG PO TABS: ORAL_TABLET | ORAL | 0 refills | Status: DC

## 2015-09-11 MED ORDER — KETOROLAC TROMETHAMINE 30 MG/ML IJ SOLN
30.0000 mg | Freq: Once | INTRAMUSCULAR | Status: AC
  Administered 2015-09-11: 30 mg via INTRAVENOUS
  Filled 2015-09-11: qty 1

## 2015-09-11 MED ORDER — DIPHENHYDRAMINE HCL 50 MG/ML IJ SOLN
25.0000 mg | Freq: Once | INTRAMUSCULAR | Status: AC
  Administered 2015-09-11: 25 mg via INTRAVENOUS
  Filled 2015-09-11: qty 1

## 2015-09-11 MED ORDER — METHOCARBAMOL 500 MG PO TABS: ORAL_TABLET | ORAL | 0 refills | Status: DC

## 2015-09-11 MED ORDER — METOCLOPRAMIDE HCL 5 MG/ML IJ SOLN
10.0000 mg | Freq: Once | INTRAMUSCULAR | Status: AC
  Administered 2015-09-11: 10 mg via INTRAVENOUS
  Filled 2015-09-11: qty 2

## 2015-09-11 NOTE — ED Provider Notes (Signed)
[facility] Provider Note   CSN: [id number] Arrival date & time: 09/11/15  0226  First Provider Contact:  First MD Initiated Contact with Patient 09/11/15 [phone]        History   Chief Complaint Chief Complaint  Patient presents with  . Chest Pain    HPI [name] is a 25 y.o. female.  HPI     patient and husband report for the past 6 months patient has awakened in the middle the night feeling that she can't breathe. She states she was fine all day today. She went to bed about 10:30. About an hour prior to arrival to the ER sheath woke up and felt like she was short of breath and that she had an elephant sitting on her chest. She states she couldn't breathe. She tried to kick her husband awake but he wouldn't wake up. She then went into the living room and was crying to screening. Husband states when he woke up and went in her face was red, her face was not blue. She denies wheezing or coughing, she was diaphoretic. She had nausea without vomiting. Since then she's been screaming and crying. About 15 minutes before he came into her room she state psych she felt like she has a ball in the center of her chest. She states she's had panic attacks and she was 25 years old but this seems different. They report they went to the beach recently and she traveled about 4 hours. She denies any pain or swelling in her extremities.    PCP [facility] in [place]  Past Medical History:  Diagnosis Date  . Anxiety   . Depression   . PONV (postoperative nausea and vomiting)     Patient Active Problem List   Diagnosis Date Noted  . Pre-op exam 04/13/2013  . 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 Persistent headaches 09/21/2012  . Depression with anxiety 04/16/2006  . ATTENTION DEFICIT, W/HYPERACTIVITY 04/16/2006    Past Surgical History:  Procedure Laterality Date  . CESAREAN SECTION N/A 02/07/2013   Procedure: CESAREAN SECTION;  Surgeon: Allie Bossier, MD;   Location: [facility];  Service: Obstetrics;  Laterality: N/A;  . CHOLECYSTECTOMY    . GALLBLADDER SURGERY    . LAPAROSCOPIC BILATERAL SALPINGECTOMY Bilateral 04/15/2013   Procedure: LAPAROSCOPIC BILATERAL SALPINGECTOMY;  Surgeon: Tilda Burrow, MD;  Location: [facility];  Service: Gynecology;  Laterality: Bilateral;  . REMOVAL OF NON VAGINAL CONTRACEPTIVE DEVICE Left 04/15/2013   Procedure: REMOVAL OF NON VAGINAL CONTRACEPTIVE DEVICE IMPLANON;  Surgeon: Tilda Burrow, MD;  Location: [facility];  Service: Gynecology;  Laterality: Left;    OB History    Gravida Para Term Preterm AB Living   2 2 2     2    SAB TAB Ectopic Multiple Live Births                   Home Medications    Was on celexa and xanax but hasn't taken in a couple of years  Prior to Admission medications   Medication Sig Start Date End Date Taking? Authorizing Provider  ALPRAZolam Prudy Feeler) 0.5 MG tablet Take 0.5 mg by mouth as needed for anxiety.   Yes Historical Provider, MD  escitalopram (LEXAPRO) 20 MG tablet Take 1 tablet (20 mg total) by mouth daily. 03/09/13   Cheral Marker, CNM  etonogestrel (NEXPLANON) 68 MG IMPL implant Inject 1 each into the skin once.    Historical Provider, MD  LORazepam (ATIVAN)  0.5 MG tablet Take 1 tablet (0.5 mg total) by mouth 2 (two) times daily as needed for anxiety. 03/09/13   Cheral Marker, CNM  methocarbamol (ROBAXIN) 500 MG tablet Take 1 or 2 po Q 6hrs for pain 09/11/15   Devoria Albe, MD  naproxen (NAPROSYN) 500 MG tablet Take 1 po BID with food prn pain 09/11/15   Devoria Albe, MD  oxyCODONE-acetaminophen (PERCOCET/ROXICET) 5-325 MG per tablet Take 1 tablet by mouth every 4 (four) hours as needed. 04/15/13   Tilda Burrow, MD  vitamin B-12 (CYANOCOBALAMIN) 100 MCG tablet Take 100 mcg by mouth daily.    Historical Provider, MD    Family History Family History  Problem Relation Age of Onset  . Lupus Other     grandmother  . Hypertension Other   . Diabetes Other   . Heart disease Other      CAD  . Thyroid disease Other     Social History Social History  Substance Use Topics  . Smoking status: Former Smoker    Packs/day: 0.50    Years: 1.00    Types: Cigarettes  . Smokeless tobacco: Never Used  . 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    Physical Exam Updated Vital Signs BP 107/59   Pulse 85   Temp 98.2 F (36.8 C) (Oral)   Resp 16   Ht 5\' 4"  (1.626 m)   Wt 187 lb (84.8 kg)   LMP 09/09/2015   SpO2 94%   BMI 32.10 kg/m   Physical Exam  Constitutional: She is oriented to person, place, and time. She appears well-developed and well-nourished.  Non-toxic appearance. She does not appear ill. No distress.  HENT:  Head: Normocephalic and atraumatic.  Right Ear: External ear normal.  Left Ear: External ear normal.  Nose: Nose normal. No mucosal edema or rhinorrhea.  Mouth/Throat: Oropharynx is clear and moist and mucous membranes are normal. No dental abscesses or uvula swelling.  Eyes: Conjunctivae and EOM are normal. Pupils are equal, round, and reactive to light.  Neck: Normal range of motion and full passive range of motion without pain. Neck supple.  Cardiovascular: Normal rate, regular rhythm and normal heart sounds.  Exam reveals no gallop and no friction rub.   No murmur heard. Pulmonary/Chest: Effort normal and breath sounds normal. No respiratory distress. She has no wheezes. She has no rhonchi. She has no rales. She exhibits no tenderness and no crepitus.    Patient is very tender to her right lower costochondral junctions.  Abdominal: Soft. Normal appearance and bowel sounds are normal. She exhibits no distension. There is no tenderness. There is no rebound and no guarding.  Musculoskeletal: Normal range of motion. She  exhibits no edema or tenderness.  Moves all extremities well.   Neurological: She is alert and oriented to person, place, and time. She has normal strength. No cranial nerve deficit.  Skin: Skin is warm, dry and intact. No rash noted. No erythema. No pallor.  Psychiatric: Her speech is normal and behavior is normal. Her mood appears anxious.  Nursing note and vitals reviewed.    ED Treatments / Results  Labs (all labs ordered are listed, but only abnormal results are displayed) Results for orders placed or performed during  the hospital encounter of 09/11/15  Comprehensive metabolic panel  Result Value Ref Range   Sodium 139 135 - 145 mmol/L   Potassium 3.8 3.5 - 5.1 mmol/L   Chloride 109 101 - 111 mmol/L   CO2 24 22 - 32 mmol/L   Glucose, Bld 101 (H) 65 - 99 mg/dL   BUN 13 6 - 20 mg/dL   Creatinine, Ser 1.61 0.44 - 1.00 mg/dL   Calcium 8.9 8.9 - 09.6 mg/dL   Total Protein 6.9 6.5 - 8.1 g/dL   Albumin 4.0 3.5 - 5.0 g/dL   AST 26 15 - 41 U/L   ALT 82 (H) 14 - 54 U/L   Alkaline Phosphatase 68 38 - 126 U/L   Total Bilirubin 0.3 0.3 - 1.2 mg/dL   GFR calc non Af Amer >60 >60 mL/min   GFR calc Af Amer >60 >60 mL/min   Anion gap 6 5 - 15  Troponin I  Result Value Ref Range   Troponin I <0.03 <0.03 ng/mL  CBC with Differential  Result Value Ref Range   WBC 9.8 4.0 - 10.5 K/uL   RBC 4.85 3.87 - 5.11 MIL/uL   Hemoglobin 13.8 12.0 - 15.0 g/dL   HCT 04.5 40.9 - 81.1 %   MCV 81.4 78.0 - 100.0 fL   MCH 28.5 26.0 - 34.0 pg   MCHC 34.9 30.0 - 36.0 g/dL   RDW 91.4 78.2 - 95.6 %   Platelets 242 150 - 400 K/uL   Neutrophils Relative % 62 %   Neutro Abs 6.0 1.7 - 7.7 K/uL   Lymphocytes Relative 26 %   Lymphs Abs 2.6 0.7 - 4.0 K/uL   Monocytes Relative 8 %   Monocytes Absolute 0.8 0.1 - 1.0 K/uL   Eosinophils Relative 4 %   Eosinophils Absolute 0.4 0.0 - 0.7 K/uL   Basophils Relative 0 %   Basophils Absolute 0.0 0.0 - 0.1 K/uL  D-dimer, quantitative  Result Value Ref Range    D-Dimer, Quant <0.27 0.00 - 0.50 ug/mL-FEU   Laboratory interpretation all normal    EKG  # 1  EKG Interpretation  Date/Time:  Tuesday September 11 2015 02:40:01 EDT Ventricular Rate:  84 PR Interval:    QRS Duration: 83 QT Interval:  376 QTC Calculation: 445 R Axis:   110 Text Interpretation:  Right and left arm electrode reversal, interpretation assumes no reversal Sinus rhythm Consider right ventricular hypertrophy Abnormal T, consider ischemia, lateral leads Since last tracing rate faster 19 Nov 2012 Confirmed by [facility]  MD-I, Mulki Roesler ([id number]) on 09/11/2015 2:45:45 AM      # 2 when had the "ball" in her chest  EKG Interpretation  Date/Time:  Tuesday September 11 2015 03:00:17 EDT Ventricular Rate:  116 PR Interval:    QRS Duration: 88 QT Interval:  321 QTC Calculation: 458 R Axis:   89 Text Interpretation:  Sinus tachycardia Ventricular premature complex Low voltage, precordial leads Abnormal T, consider ischemia, lateral leads Baseline wander in lead(s) I II III aVR aVL aVF V1 V4 V5 V6 Confirmed by Mckaila Duffus  MD-I, Roselynne Lortz ([id number]) on 09/11/2015 3:52:46 AM        Radiology Dg Chest 2 View  Result Date: 09/11/2015 CLINICAL DATA:  25 year old female with central chest pain EXAM: CHEST  2 VIEW COMPARISON:  None. FINDINGS: The heart size and mediastinal contours are within normal limits. Both lungs are clear. The visualized skeletal structures are unremarkable. IMPRESSION: No active cardiopulmonary disease. Electronically Signed   By:  Elgie Collard M.D.   On: 09/11/2015 04:01   Procedures Procedures (including critical care time)  Medications Ordered in ED Medications  ketorolac (TORADOL) 30 MG/ML injection 30 mg (30 mg Intravenous Given 09/11/15 0337)  metoCLOPramide (REGLAN) injection 10 mg (10 mg Intravenous Given 09/11/15 0415)  diphenhydrAMINE (BENADRYL) injection 25 mg (25 mg Intravenous Given 09/11/15 0415)   Patient was given Toradol IV for her presumed chest wall  pain/costochondritis. We discussed doing laboratory testing and chest x-ray to look for other etiologies of her chest pain such as PE/DVT, pneumonia, cardiac event.  Recheck at 6:20 AM patient is sleeping. She states her pain is much improved. I am now able to palpate her right chest and it does not appear nearly as painful as it did before. We discussed her test results and the fact that her pain appears to be costochondritis or chest wall pain.   Initial Impression / Assessment and Plan / ED Course  I have reviewed the triage vital signs and the nursing notes.  Pertinent labs & imaging results that were available during my care of the patient were reviewed by me and considered in my medical decision making (see chart for details).     Final Clinical Impressions(s) / ED Diagnoses   Final diagnoses:  Costochondritis, acute  Panic attacks    New Prescriptions New Prescriptions   METHOCARBAMOL (ROBAXIN) 500 MG TABLET    Take 1 or 2 po Q 6hrs for pain   NAPROXEN (NAPROSYN) 500 MG TABLET    Take 1 po BID with food prn pain    Plan discharge  Devoria Albe, MD, Concha Pyo, MD 09/11/15 [phone]

## 2015-09-11 NOTE — ED Notes (Signed)
Called into patients room. Patient states "my fucking chest is killing me, I cant fucking breath"  Asked patient not to curse at this nurse. Patient began to raise voice. This nurse coached patient to slow her breathing... In through nose and out through mouth. Patients oxygen saturation is 99% on room air. Patient able to slow breathing down with coaching. EDP notified. Family member remains at bedside stating "just chill out, you need to calm down" patient states "i cant fucking breath"

## 2015-09-11 NOTE — ED Notes (Signed)
Patient verbalizes understanding of discharge instructions, prescriptions, home care and follow up care. Patient out of department at this time.s 

## 2015-09-11 NOTE — Discharge Instructions (Signed)
Use ice and heat on your chest for comfort. Take the anti-inflammatory and  muscle relaxer for your discomfort. Consider going to a mental health facility to discuss your panic attacks and anxiety. Recheck if you seem worse such as a fever, cough, struggling to breathe, or if you feel like it's worse.

## 2015-09-11 NOTE — ED Notes (Signed)
Patient resting in bed at this time. Eyes closed, even rise and fall of chest

## 2015-09-11 NOTE — ED Triage Notes (Signed)
Patient states she woke up with central chest pain accompanied by shortness of breath and light headedness. Patient states she has a history of anxiety, and history of a heart murmur. Denies any other symptoms

## 2016-11-22 ENCOUNTER — Emergency Department (HOSPITAL_COMMUNITY): Payer: Medicaid Other

## 2016-11-22 ENCOUNTER — Emergency Department (HOSPITAL_COMMUNITY)
Admission: EM | Admit: 2016-11-22 | Discharge: 2016-11-22 | Disposition: A | Discharge status: Home Health | Payer: Medicaid Other | Attending: Emergency Medicine | Admitting: Emergency Medicine

## 2016-11-22 DIAGNOSIS — Y92410 Unspecified street and highway as the place of occurrence of the external cause: Secondary | ICD-10-CM | POA: Insufficient documentation

## 2016-11-22 DIAGNOSIS — Z87891 Personal history of nicotine dependence: Secondary | ICD-10-CM | POA: Insufficient documentation

## 2016-11-22 DIAGNOSIS — T148XXA Other injury of unspecified body region, initial encounter: Secondary | ICD-10-CM | POA: Insufficient documentation

## 2016-11-22 DIAGNOSIS — Y999 Unspecified external cause status: Secondary | ICD-10-CM | POA: Diagnosis not present

## 2016-11-22 DIAGNOSIS — Y9389 Activity, other specified: Secondary | ICD-10-CM | POA: Insufficient documentation

## 2016-11-22 DIAGNOSIS — Z79899 Other long term (current) drug therapy: Secondary | ICD-10-CM | POA: Insufficient documentation

## 2016-11-22 DIAGNOSIS — R51 Headache: Secondary | ICD-10-CM | POA: Insufficient documentation

## 2016-11-22 DIAGNOSIS — R079 Chest pain, unspecified: Secondary | ICD-10-CM | POA: Insufficient documentation

## 2016-11-22 DIAGNOSIS — Z23 Encounter for immunization: Secondary | ICD-10-CM | POA: Insufficient documentation

## 2016-11-22 DIAGNOSIS — M542 Cervicalgia: Secondary | ICD-10-CM | POA: Diagnosis present

## 2016-11-22 LAB — URINALYSIS, ROUTINE W REFLEX MICROSCOPIC
BACTERIA UA: NONE SEEN
BILIRUBIN URINE: NEGATIVE
Glucose, UA: NEGATIVE mg/dL
HGB URINE DIPSTICK: NEGATIVE
Ketones, ur: NEGATIVE mg/dL
Nitrite: NEGATIVE
PROTEIN: NEGATIVE mg/dL
SPECIFIC GRAVITY, URINE: 1.032 — AB (ref 1.005–1.030)
pH: 7 (ref 5.0–8.0)

## 2016-11-22 LAB — ETHANOL: Alcohol, Ethyl (B): <10 mg/dL (ref <10)

## 2016-11-22 LAB — I-STAT CHEM 8, ED
BUN: 11 mg/dL (ref 6–20)
CREATININE: 1 mg/dL (ref 0.44–1.00)
Calcium, Ion: 1.21 mmol/L (ref 1.15–1.40)
Chloride: 105 mmol/L (ref 101–111)
GLUCOSE: 90 mg/dL (ref 65–99)
HEMATOCRIT: 42 % (ref 36.0–46.0)
Hemoglobin: 14.3 g/dL (ref 12.0–15.0)
Potassium: 4.1 mmol/L (ref 3.5–5.1)
Sodium: 139 mmol/L (ref 135–145)
TCO2: 24 mmol/L (ref 22–32)

## 2016-11-22 LAB — COMPREHENSIVE METABOLIC PANEL
ALT: 24 U/L (ref 14–54)
AST: 22 U/L (ref 15–41)
Albumin: 4.5 g/dL (ref 3.5–5.0)
Alkaline Phosphatase: 73 U/L (ref 38–126)
Anion gap: 7 (ref 5–15)
BILIRUBIN TOTAL: 0.4 mg/dL (ref 0.3–1.2)
BUN: 12 mg/dL (ref 6–20)
CO2: 26 mmol/L (ref 22–32)
CREATININE: 1.04 mg/dL — AB (ref 0.44–1.00)
Calcium: 9.9 mg/dL (ref 8.9–10.3)
Chloride: 106 mmol/L (ref 101–111)
GFR calc Af Amer: >60 mL/min (ref >60)
GFR calc non Af Amer: >60 mL/min (ref >60)
Glucose, Bld: 88 mg/dL (ref 65–99)
POTASSIUM: 4.1 mmol/L (ref 3.5–5.1)
Sodium: 139 mmol/L (ref 135–145)
Total Protein: 7.8 g/dL (ref 6.5–8.1)

## 2016-11-22 LAB — PROTIME-INR
INR: 0.89
Prothrombin Time: 12 seconds (ref 11.4–15.2)

## 2016-11-22 LAB — CBC
HEMATOCRIT: 43.7 % (ref 36.0–46.0)
Hemoglobin: 15 g/dL (ref 12.0–15.0)
MCH: 28 pg (ref 26.0–34.0)
MCHC: 34.3 g/dL (ref 30.0–36.0)
MCV: 81.5 fL (ref 78.0–100.0)
PLATELETS: 246 10*3/uL (ref 150–400)
RBC: 5.36 MIL/uL — ABNORMAL HIGH (ref 3.87–5.11)
RDW: 13.3 % (ref 11.5–15.5)
WBC: 16.8 10*3/uL — ABNORMAL HIGH (ref 4.0–10.5)

## 2016-11-22 LAB — I-STAT CG4 LACTIC ACID, ED: LACTIC ACID, VENOUS: 0.98 mmol/L (ref 0.5–1.9)

## 2016-11-22 LAB — SAMPLE TO BLOOD BANK

## 2016-11-22 MED ORDER — HYDROMORPHONE HCL 1 MG/ML IJ SOLN
0.5000 mg | Freq: Once | INTRAMUSCULAR | Status: AC
  Administered 2016-11-22: 0.5 mg via INTRAVENOUS
  Filled 2016-11-22: qty 1

## 2016-11-22 MED ORDER — IBUPROFEN 800 MG PO TABS: 800.0000 mg | ORAL_TABLET | Freq: Three times a day (TID) | ORAL | 1 refills | Status: AC | PRN

## 2016-11-22 MED ORDER — TETANUS-DIPHTH-ACELL PERTUSSIS 5-2.5-18.5 LF-MCG/0.5 IM SUSP
0.5000 mL | Freq: Once | INTRAMUSCULAR | Status: AC
  Administered 2016-11-22: 0.5 mL via INTRAMUSCULAR
  Filled 2016-11-22: qty 0.5

## 2016-11-22 MED ORDER — IOPAMIDOL (ISOVUE-300) INJECTION 61%
100.0000 mL | Freq: Once | INTRAVENOUS | Status: AC | PRN
  Administered 2016-11-22: 100 mL via INTRAVENOUS

## 2016-11-22 MED ORDER — SODIUM CHLORIDE 0.9 % IV BOLUS (SEPSIS)
1000.0000 mL | Freq: Once | INTRAVENOUS | Status: AC
  Administered 2016-11-22: 1000 mL via INTRAVENOUS

## 2016-11-22 MED ORDER — ONDANSETRON HCL 4 MG/2ML IJ SOLN
4.0000 mg | Freq: Once | INTRAMUSCULAR | Status: AC
  Administered 2016-11-22: 4 mg via INTRAVENOUS
  Filled 2016-11-22: qty 2

## 2016-11-22 NOTE — ED Notes (Signed)
Pt up and ambulated to bathroom.  Pt is in NAD
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
## 2016-11-22 NOTE — Discharge Instructions (Signed)
Follow up with your md next week if any problems °
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
## 2016-11-26 LAB — CDS SEROLOGY

## 2016-12-09 MED FILL — BUPRENORPHINE 8 MG TAB SL: 8 | 7 days supply | Qty: 7 | Fill #0

## 2016-12-16 MED FILL — BUPRENORPHINE 8 MG TAB SL: 8 | 7 days supply | Qty: 11 | Fill #0

## 2016-12-24 MED FILL — BUPRENORPHINE 8 MG TAB SL: 8 | 9 days supply | Qty: 14 | Fill #0

## 2017-01-02 MED FILL — BUPRENORPHINE 8 MG TAB SL: 8 | 14 days supply | Qty: 28 | Fill #0

## 2017-01-16 MED FILL — BUPRENORPHINE 8 MG TAB SL: 8 | 14 days supply | Qty: 35 | Fill #0

## 2017-06-23 ENCOUNTER — Other Ambulatory Visit: Payer: Self-pay | Admitting: Advanced Practice Midwife
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
## 2023-02-06 ENCOUNTER — Encounter: Payer: MEDICAID | Admitting: Obstetrics & Gynecology

## 2023-04-06 ENCOUNTER — Other Ambulatory Visit (HOSPITAL_COMMUNITY): Payer: Self-pay

## 2023-04-07 ENCOUNTER — Other Ambulatory Visit (HOSPITAL_COMMUNITY): Payer: Self-pay

## 2023-04-07 MED ORDER — BUPRENORPHINE HCL 8 MG SL SUBL
8.0000 mg | SUBLINGUAL_TABLET | Freq: Two times a day (BID) | SUBLINGUAL | 0 refills | Status: DC
  Filled 2023-04-07: qty 28, 14d supply, fill #0

## 2023-04-21 ENCOUNTER — Other Ambulatory Visit (HOSPITAL_COMMUNITY): Payer: Self-pay

## 2023-04-21 MED ORDER — BUPRENORPHINE HCL 8 MG SL SUBL
8.0000 mg | SUBLINGUAL_TABLET | Freq: Two times a day (BID) | SUBLINGUAL | 0 refills | Status: DC
  Filled 2023-04-21: qty 56, 28d supply, fill #0

## 2023-05-18 ENCOUNTER — Other Ambulatory Visit (HOSPITAL_COMMUNITY): Payer: Self-pay

## 2023-05-18 MED ORDER — BUPRENORPHINE HCL 8 MG SL SUBL
8.0000 mg | SUBLINGUAL_TABLET | Freq: Two times a day (BID) | SUBLINGUAL | 0 refills | Status: DC
  Filled 2023-05-18: qty 56, 28d supply, fill #0

## 2023-06-15 ENCOUNTER — Other Ambulatory Visit (HOSPITAL_COMMUNITY): Payer: Self-pay

## 2023-06-15 MED ORDER — BUPRENORPHINE HCL 8 MG SL SUBL
8.0000 mg | SUBLINGUAL_TABLET | Freq: Two times a day (BID) | SUBLINGUAL | 0 refills | Status: DC
  Filled 2023-06-15: qty 56, 28d supply, fill #0

## 2023-06-25 ENCOUNTER — Encounter (HOSPITAL_COMMUNITY): Payer: Self-pay | Admitting: Obstetrics and Gynecology

## 2023-06-25 NOTE — Progress Notes (Signed)
 Spoke w/ via phone for pre-op interview--- [facility] needs dos---- CBC and UPT per surgeon.         Lab results------ COVID test -----patient states asymptomatic no test needed Arrive at -------0830 NPO after MN NO Solid Food.  Clear liquids from MN until---0730 Pre-Surgery Ensure or G2:  Med rec completed Medications to take morning of surgery -----NONE Diabetic medication -----  Per Dr Andy Bannister Church's instructions, pt advised to hold Subutex  24 hrs prior to procedure.  GLP1 agonist last dose: GLP1 instructions:  Patient instructed no nail polish to be worn day of surgery Patient instructed to bring photo id and insurance card day of surgery Patient aware to have Driver (ride ) / caregiver    for 24 hours after surgery -  Husband [employer] Patient Special Instructions ----- Shower with antibacterial soap. Pre-Op special Instructions -----  Patient verbalized understanding of instructions that were given at this phone interview. Patient denies chest pain, sob, fever, cough at the interview.

## 2023-07-01 NOTE — H&P (Signed)
 [name] is an 33 y.o. female. Has menses monthly, 2 in November, heavy for 4-8 days, some cramps. Was going to do endometrial ablation last year in [place] but moved. Had normal pelvic u/s in [place]. Benign pipelle 4-24. She desires to proceed with endometrial ablation  Pertinent Gynecological History: Last pap: normal Date: 06/11/2023 OB History: G3, P1112 SVD x 1, LTCS x 1   Menstrual History: No LMP recorded (lmp unknown).    Past Medical History:  Diagnosis Date   Anxiety    Depression    PONV (postoperative nausea and vomiting)     Past Surgical History:  Procedure Laterality Date   CESAREAN SECTION N/A 02/07/2013   Procedure: CESAREAN SECTION;  Surgeon: Ana Balling, MD;  Location: [facility];  Service: Obstetrics;  Laterality: N/A;   CHOLECYSTECTOMY     GALLBLADDER SURGERY     LAPAROSCOPIC BILATERAL SALPINGECTOMY Bilateral 04/15/2013   Procedure: LAPAROSCOPIC BILATERAL SALPINGECTOMY;  Surgeon: Albino Hum, MD;  Location: [facility];  Service: Gynecology;  Laterality: Bilateral;   REMOVAL OF NON VAGINAL CONTRACEPTIVE DEVICE Left 04/15/2013   Procedure: REMOVAL OF NON VAGINAL CONTRACEPTIVE DEVICE IMPLANON;  Surgeon: Albino Hum, MD;  Location: [facility];  Service: Gynecology;  Laterality: Left;    Family History  Problem Relation Age of Onset   Lupus Other        grandmother   Hypertension Other    Diabetes Other    Heart disease Other        CAD   Thyroid disease Other     Social History:  reports that she has quit smoking. Her smoking use included cigarettes. She has a 0.5 pack-year smoking history. She has never used smokeless tobacco. She reports current alcohol use. She reports that she does not use drugs.  Allergies:  Allergies  Allergen Reactions   Amoxicillin Anaphylaxis   Penicillins Anaphylaxis   Codeine Itching   Morphine  And Codeine Itching    No medications prior to admission.    Review of Systems  Respiratory: Negative.     Cardiovascular: Negative.     Height 5\' 4"  (1.626 m), weight 78.5 kg. Physical Exam Constitutional:      Appearance: Normal appearance.  Cardiovascular:     Rate and Rhythm: Normal rate and regular rhythm.     Heart sounds: Normal heart sounds. No murmur heard. Pulmonary:     Effort: Pulmonary effort is normal. No respiratory distress.     Breath sounds: Normal breath sounds. No wheezing.  Abdominal:     General: There is no distension.     Palpations: Abdomen is soft. There is no mass.     Tenderness: There is no abdominal tenderness.  Genitourinary:    General: Normal vulva.     Comments: Normal uterus No adnexal mass Musculoskeletal:     Cervical back: Normal range of motion and neck supple.  Neurological:     Mental Status: She is alert.     No results found for this or any previous visit (from the past 24 hours).  No results found.  Assessment/Plan: Menorrhagia, benign pipelle, normal pelvic ultrasound. All medical and surgical options have been discussed, she desires to proceed with endometrial ablation. Surgical procedure, risks, alternatives, chances of relieving symptoms all discussed, questions answered. Will admit for hysteroscopy, Novasure endometrial ablation  Lillie Reining 07/01/2023, 7:39 PM

## 2023-07-02 ENCOUNTER — Other Ambulatory Visit: Payer: Self-pay

## 2023-07-02 ENCOUNTER — Ambulatory Visit (HOSPITAL_BASED_OUTPATIENT_CLINIC_OR_DEPARTMENT_OTHER)

## 2023-07-02 ENCOUNTER — Encounter (HOSPITAL_COMMUNITY)
Admission: RE | Disposition: A | Discharge status: Home / Self Care | Payer: Self-pay | Source: Home / Self Care | Attending: Obstetrics and Gynecology

## 2023-07-02 ENCOUNTER — Ambulatory Visit (HOSPITAL_COMMUNITY)
Admission: RE | Admit: 2023-07-02 | Discharge: 2023-07-02 | Disposition: A | Discharge status: Home / Self Care | Attending: Obstetrics and Gynecology | Admitting: Obstetrics and Gynecology

## 2023-07-02 ENCOUNTER — Ambulatory Visit (HOSPITAL_COMMUNITY): Payer: Self-pay

## 2023-07-02 ENCOUNTER — Encounter (HOSPITAL_COMMUNITY): Payer: Self-pay | Admitting: Obstetrics and Gynecology

## 2023-07-02 DIAGNOSIS — F419 Anxiety disorder, unspecified: Secondary | ICD-10-CM | POA: Insufficient documentation

## 2023-07-02 DIAGNOSIS — F17210 Nicotine dependence, cigarettes, uncomplicated: Secondary | ICD-10-CM | POA: Diagnosis not present

## 2023-07-02 DIAGNOSIS — F32A Depression, unspecified: Secondary | ICD-10-CM | POA: Diagnosis not present

## 2023-07-02 DIAGNOSIS — N920 Excessive and frequent menstruation with regular cycle: Secondary | ICD-10-CM

## 2023-07-02 HISTORY — PX: HYSTEROSCOPY: SHX211

## 2023-07-02 HISTORY — PX: HYDROTHERMAL ABLATION/NOVASURE: SHX7586

## 2023-07-02 LAB — CBC
HCT: 41.6 % (ref 36.0–46.0)
Hemoglobin: 14.7 g/dL (ref 12.0–15.0)
MCH: 28.6 pg (ref 26.0–34.0)
MCHC: 35.3 g/dL (ref 30.0–36.0)
MCV: 80.9 fL (ref 80.0–100.0)
Platelets: 230 10*3/uL (ref 150–400)
RBC: 5.14 MIL/uL — ABNORMAL HIGH (ref 3.87–5.11)
RDW: 13 % (ref 11.5–15.5)
WBC: 7.1 10*3/uL (ref 4.0–10.5)
nRBC: 0 % (ref 0.0–0.2)

## 2023-07-02 LAB — POCT PREGNANCY, URINE: Preg Test, Ur: NEGATIVE

## 2023-07-02 SURGERY — ABLATION, ENDOMETRIUM, HYSTEROSCOPIC
Anesthesia: General | Site: Vagina

## 2023-07-02 MED ORDER — KETOROLAC TROMETHAMINE 30 MG/ML IJ SOLN
INTRAMUSCULAR | Status: AC
  Filled 2023-07-02: qty 1

## 2023-07-02 MED ORDER — LACTATED RINGERS IV SOLN: INTRAVENOUS | Status: DC

## 2023-07-02 MED ORDER — OXYCODONE HCL 5 MG PO TABS
ORAL_TABLET | ORAL | Status: AC
  Filled 2023-07-02: qty 1

## 2023-07-02 MED ORDER — ONDANSETRON HCL 4 MG/2ML IJ SOLN
INTRAMUSCULAR | Status: DC | PRN
  Administered 2023-07-02: 4 mg via INTRAVENOUS

## 2023-07-02 MED ORDER — GLYCOPYRROLATE PF 0.2 MG/ML IJ SOSY
PREFILLED_SYRINGE | INTRAMUSCULAR | Status: AC
  Filled 2023-07-02: qty 1

## 2023-07-02 MED ORDER — AMISULPRIDE (ANTIEMETIC) 5 MG/2ML IV SOLN: 10.0000 mg | Freq: Once | INTRAVENOUS | Status: DC | PRN

## 2023-07-02 MED ORDER — DEXMEDETOMIDINE HCL IN NACL 80 MCG/20ML IV SOLN
INTRAVENOUS | Status: DC | PRN
  Administered 2023-07-02: 8 ug via INTRAVENOUS
  Administered 2023-07-02: 4 ug via INTRAVENOUS
  Administered 2023-07-02: 8 ug via INTRAVENOUS
  Administered 2023-07-02: 4 ug via INTRAVENOUS

## 2023-07-02 MED ORDER — MIDAZOLAM HCL 5 MG/5ML IJ SOLN
INTRAMUSCULAR | Status: DC | PRN
  Administered 2023-07-02: 2 mg via INTRAVENOUS

## 2023-07-02 MED ORDER — LIDOCAINE 2% (20 MG/ML) 5 ML SYRINGE
INTRAMUSCULAR | Status: AC
  Filled 2023-07-02: qty 5

## 2023-07-02 MED ORDER — IBUPROFEN 600 MG PO TABS: 600.0000 mg | ORAL_TABLET | Freq: Four times a day (QID) | ORAL | 0 refills | Status: AC | PRN

## 2023-07-02 MED ORDER — FENTANYL CITRATE (PF) 100 MCG/2ML IJ SOLN
25.0000 ug | INTRAMUSCULAR | Status: DC | PRN
  Administered 2023-07-02 (×3): 50 ug via INTRAVENOUS

## 2023-07-02 MED ORDER — DEXAMETHASONE SODIUM PHOSPHATE 10 MG/ML IJ SOLN
INTRAMUSCULAR | Status: AC
  Filled 2023-07-02: qty 1

## 2023-07-02 MED ORDER — ACETAMINOPHEN 500 MG PO TABS
1000.0000 mg | ORAL_TABLET | Freq: Once | ORAL | Status: AC
  Administered 2023-07-02: 1000 mg via ORAL

## 2023-07-02 MED ORDER — ONDANSETRON HCL 4 MG/2ML IJ SOLN: 4.0000 mg | Freq: Once | INTRAMUSCULAR | Status: DC | PRN

## 2023-07-02 MED ORDER — PROPOFOL 10 MG/ML IV BOLUS
INTRAVENOUS | Status: DC | PRN
  Administered 2023-07-02: 180 mg via INTRAVENOUS

## 2023-07-02 MED ORDER — SCOPOLAMINE 1 MG/3DAYS TD PT72
MEDICATED_PATCH | TRANSDERMAL | Status: AC
  Filled 2023-07-02: qty 1

## 2023-07-02 MED ORDER — ONDANSETRON HCL 4 MG/2ML IJ SOLN
INTRAMUSCULAR | Status: AC
  Filled 2023-07-02: qty 2

## 2023-07-02 MED ORDER — OXYCODONE HCL 5 MG PO TABS
5.0000 mg | ORAL_TABLET | Freq: Once | ORAL | Status: AC
  Administered 2023-07-02: 5 mg via ORAL

## 2023-07-02 MED ORDER — FENTANYL CITRATE (PF) 100 MCG/2ML IJ SOLN
INTRAMUSCULAR | Status: DC | PRN
Start: 2023-07-02 — End: 2023-07-02
  Administered 2023-07-02 (×2): 50 ug via INTRAVENOUS

## 2023-07-02 MED ORDER — LIDOCAINE HCL 2 % IJ SOLN
INTRAMUSCULAR | Status: DC | PRN
  Administered 2023-07-02: 16 mL

## 2023-07-02 MED ORDER — GLYCOPYRROLATE PF 0.2 MG/ML IJ SOSY
PREFILLED_SYRINGE | INTRAMUSCULAR | Status: DC | PRN
  Administered 2023-07-02: .2 mg via INTRAVENOUS

## 2023-07-02 MED ORDER — CHLORHEXIDINE GLUCONATE 0.12 % MT SOLN
15.0000 mL | Freq: Once | OROMUCOSAL | Status: AC
  Administered 2023-07-02: 15 mL via OROMUCOSAL

## 2023-07-02 MED ORDER — LIDOCAINE HCL 2 % IJ SOLN
INTRAMUSCULAR | Status: AC
  Filled 2023-07-02: qty 20

## 2023-07-02 MED ORDER — ORAL CARE MOUTH RINSE: 15.0000 mL | Freq: Once | OROMUCOSAL | Status: AC

## 2023-07-02 MED ORDER — FENTANYL CITRATE (PF) 100 MCG/2ML IJ SOLN
INTRAMUSCULAR | Status: AC
  Filled 2023-07-02: qty 2

## 2023-07-02 MED ORDER — HYDROCODONE-ACETAMINOPHEN 5-325 MG PO TABS: 1.0000 | ORAL_TABLET | ORAL | 0 refills | Status: AC | PRN

## 2023-07-02 MED ORDER — LIDOCAINE 2% (20 MG/ML) 5 ML SYRINGE
INTRAMUSCULAR | Status: DC | PRN
  Administered 2023-07-02: 80 mg via INTRAVENOUS

## 2023-07-02 MED ORDER — PROPOFOL 10 MG/ML IV BOLUS
INTRAVENOUS | Status: AC
  Filled 2023-07-02: qty 20

## 2023-07-02 MED ORDER — SODIUM CHLORIDE 0.9 % IR SOLN
Status: DC | PRN
  Administered 2023-07-02: 3000 mL

## 2023-07-02 MED ORDER — FENTANYL CITRATE (PF) 250 MCG/5ML IJ SOLN
INTRAMUSCULAR | Status: AC
  Filled 2023-07-02: qty 5

## 2023-07-02 MED ORDER — KETOROLAC TROMETHAMINE 30 MG/ML IJ SOLN
INTRAMUSCULAR | Status: DC | PRN
  Administered 2023-07-02: 30 mg via INTRAVENOUS

## 2023-07-02 MED ORDER — ACETAMINOPHEN 500 MG PO TABS
ORAL_TABLET | ORAL | Status: DC
Start: 2023-07-02 — End: 2023-07-02
  Filled 2023-07-02: qty 2

## 2023-07-02 MED ORDER — MIDAZOLAM HCL 2 MG/2ML IJ SOLN
INTRAMUSCULAR | Status: AC
  Filled 2023-07-02: qty 2

## 2023-07-02 MED ORDER — SCOPOLAMINE 1 MG/3DAYS TD PT72
1.0000 | MEDICATED_PATCH | Freq: Once | TRANSDERMAL | Status: DC
  Administered 2023-07-02: 1.5 mg via TRANSDERMAL

## 2023-07-02 MED ORDER — DEXAMETHASONE SODIUM PHOSPHATE 10 MG/ML IJ SOLN
INTRAMUSCULAR | Status: DC | PRN
  Administered 2023-07-02: 10 mg via INTRAVENOUS

## 2023-07-02 MED ORDER — CHLORHEXIDINE GLUCONATE 0.12 % MT SOLN
OROMUCOSAL | Status: AC
  Filled 2023-07-02: qty 15

## 2023-07-02 SURGICAL SUPPLY — 14 items
ABLATOR SURESOUND NOVASURE (ABLATOR) ×1 IMPLANT
GLOVE BIOGEL PI IND STRL 7.0 (GLOVE) IMPLANT
GLOVE ORTHO TXT STRL SZ7.5 (GLOVE) ×2 IMPLANT
GLOVE SURG SYN 6.5 ES PF (GLOVE) ×1 IMPLANT
GLOVE SURG SYN 6.5 PF PI (GLOVE) IMPLANT
GLOVE SURG UNDER POLY LF SZ7 (GLOVE) ×1 IMPLANT
GOWN STRL REUS W/ TWL LRG LVL3 (GOWN DISPOSABLE) ×2 IMPLANT
KIT PROCEDURE FLUENT (KITS) ×1 IMPLANT
PACK VAGINAL MINOR WOMEN LF (CUSTOM PROCEDURE TRAY) ×1 IMPLANT
PAD OB MATERNITY 11 LF (PERSONAL CARE ITEMS) ×1 IMPLANT
SOL .9 NS 3000ML IRR UROMATIC (IV SOLUTION) IMPLANT
SOL PREP POV-IOD 4OZ 10% (MISCELLANEOUS) IMPLANT
TOWEL GREEN STERILE FF (TOWEL DISPOSABLE) ×2 IMPLANT
UNDERPAD 30X36 HEAVY ABSORB (UNDERPADS AND DIAPERS) ×1 IMPLANT

## 2023-07-02 NOTE — Discharge Instructions (Addendum)
 Routine instructions for endometrial ablation  Post Anesthesia Home Care Instructions  Activity: Get plenty of rest for the remainder of the day. A responsible individual must stay with you for 24 hours following the procedure.  For the next 24 hours, DO NOT: -Drive a car -Advertising copywriter -Drink alcoholic beverages -Take any medication unless instructed by your physician -Make any legal decisions or sign important papers.  Meals: Start with liquid foods such as gelatin or soup. Progress to regular foods as tolerated. Avoid greasy, spicy, heavy foods. If nausea and/or vomiting occur, drink only clear liquids until the nausea and/or vomiting subsides. Call your physician if vomiting continues.  Special Instructions/Symptoms: Your throat may feel dry or sore from the anesthesia or the breathing tube placed in your throat during surgery. If this causes discomfort, gargle with warm salt water. The discomfort should disappear within 24 hours.  If you had a scopolamine  patch placed behind your ear for the management of post- operative nausea and/or vomiting:  1. The medication in the patch is effective for 72 hours, after which it should be removed.  Wrap patch in a tissue and discard in the trash. Wash hands thoroughly with soap and water. 2. You may remove the patch earlier than 72 hours if you experience unpleasant side effects which may include dry mouth, dizziness or visual disturbances. 3. Avoid touching the patch. Wash your hands with soap and water after contact with the patch.    Remove patch behind right ear by Sunday, Jul 05, 2023. May take Tylenol  beginning at 2 PM for soreness/cramping.

## 2023-07-02 NOTE — Anesthesia Preprocedure Evaluation (Addendum)
 Anesthesia Evaluation  Patient identified by MRN, date of birth, ID band Patient awake    Reviewed: Allergy & Precautions, NPO status , Patient's Chart, lab work & pertinent test results  History of Anesthesia Complications (+) PONV and history of anesthetic complications  Airway Mallampati: II  TM Distance: >3 FB Neck ROM: Full    Dental  (+) Teeth Intact, Dental Advisory Given   Pulmonary Current Smoker and Patient abstained from smoking.   Pulmonary exam normal breath sounds clear to auscultation       Cardiovascular negative cardio ROS Normal cardiovascular exam Rhythm:Regular Rate:Normal     Neuro/Psych  Headaches PSYCHIATRIC DISORDERS Anxiety Depression       GI/Hepatic negative GI ROS, Neg liver ROS,,,  Endo/Other  negative endocrine ROS    Renal/GU negative Renal ROS     Musculoskeletal negative musculoskeletal ROS (+)    Abdominal   Peds  Hematology negative hematology ROS (+)   Anesthesia Other Findings Day of surgery medications reviewed with the patient.  Reproductive/Obstetrics Menorrhagia with regular cycle                             Anesthesia Physical Anesthesia Plan  ASA: 2  Anesthesia Plan: General   Post-op Pain Management: Tylenol  PO (pre-op)*   Induction: Intravenous  PONV Risk Score and Plan: 4 or greater and Midazolam , Dexamethasone , Ondansetron  and Scopolamine  patch - Pre-op  Airway Management Planned: LMA  Additional Equipment:   Intra-op Plan:   Post-operative Plan: Extubation in OR  Informed Consent: I have reviewed the patients History and Physical, chart, labs and discussed the procedure including the risks, benefits and alternatives for the proposed anesthesia with the patient or authorized representative who has indicated his/her understanding and acceptance.     Dental advisory given  Plan Discussed with: CRNA  Anesthesia Plan  Comments:        Anesthesia Quick Evaluation

## 2023-07-02 NOTE — Anesthesia Procedure Notes (Addendum)
 Procedure Name: LMA Insertion Date/Time: 07/02/2023 10:08 AM  Performed by: Ezzie Holstein, CRNAPre-anesthesia Checklist: Patient identified, Emergency Drugs available, Suction available and Patient being monitored Patient Re-evaluated:Patient Re-evaluated prior to induction Oxygen Delivery Method: Circle System Utilized Preoxygenation: Pre-oxygenation with 100% oxygen Induction Type: IV induction Ventilation: Mask ventilation without difficulty LMA: LMA inserted LMA Size: 4.0 Number of attempts: 1 Placement Confirmation: positive ETCO2 Tube secured with: Tape Dental Injury: Teeth and Oropharynx as per pre-operative assessment  Comments: LMA inserted by Darlina Einstein, SRNA

## 2023-07-02 NOTE — Transfer of Care (Signed)
 Immediate Anesthesia Transfer of Care Note  Patient: [name]  Procedure(s) Performed: ABLATION, ENDOMETRIUM, HYSTEROSCOPIC, DILATION AND CURETTAGE (Vagina ) NOVASURE ABLATION (Vagina )  Patient Location: PACU  Anesthesia Type:General  Level of Consciousness: awake and patient cooperative  Airway & Oxygen Therapy: Patient Spontanous Breathing and Patient connected to nasal cannula oxygen  Post-op Assessment: Report given to RN and Post -op Vital signs reviewed and stable  Post vital signs: Reviewed and stable  Last Vitals:  Vitals Value Taken Time  BP 130/87 07/02/23 1048  Temp 36.4 C 07/02/23 1048  Pulse 99 07/02/23 1051  Resp 20 07/02/23 1051  SpO2 98 % 07/02/23 1051  Vitals shown include unfiled device data.  Last Pain:  Vitals:   07/02/23 0831  TempSrc: Oral  PainSc: 2       Patients Stated Pain Goal: 5 (07/02/23 0831)  Complications: No notable events documented.

## 2023-07-02 NOTE — Interval H&P Note (Signed)
 History and Physical Interval Note:  07/02/2023 9:39 AM  [name]  has presented today for surgery, with the diagnosis of menorrhagia.  The various methods of treatment have been discussed with the patient and family. After consideration of risks, benefits and other options for treatment, the patient has consented to  Procedure(s): ABLATION, ENDOMETRIUM, HYSTEROSCOPIC (N/A) HYDROTHERMAL ABLATION/NOVASURE (N/A) as a surgical intervention.  The patient's history has been reviewed, patient examined, no change in status, stable for surgery.  I have reviewed the patient's chart and labs.  Questions were answered to the patient's satisfaction.     [name]

## 2023-07-02 NOTE — Op Note (Signed)
 Preoperative diagnosis: Menorrhagia Postoperative diagnosis: Menorrhagia Procedure: Hysteroscopy, NovaSure endometrial ablation Surgeon: Cyd Dowse M.D. Anesthesia: Gen. With an LMA, deep paracervical block Findings: She had a normal endometrial cavity with fairly abundant endometrial tissue but no unusual lesions. The NovaSure device used to a depth of 5 cm, a width of 3.6 cm and used 93 W for 110 seconds. Fluid deficit through the hysteroscope was 130 cc. Estimated blood loss: Minimal Specimens: None Complications: None  Procedure in detail: The patient was taken to the operating room and placed in the dorsosupine position. General anesthesia was induced and she was placed in mobile stirrups. Perineum and vagina were prepped and draped in usual sterile fashion and bladder. A Graves speculum was inserted into the vagina and the anterior lip of the cervix was grasped with a single-tooth tenaculum. The paracervical block was then performed with a total of 16 cc 2% lidocaine . Uterus then sounded to 8.5 cm. Cervix was easily dilated to size 23 dilator. The Myosure hysteroscope was inserted and good visualization was achieved using lactated Ringer 's. The endometrial cavity was normal with no fibroids or significant polyps. The hysteroscope was removed. Sharp curettage was performed with return of a small amount of tissue. The cervix  measured 3.5 cm. The NovaSure device was inserted and deployed properly. The CO2 test passed. Endometrial ablation was performed with the above-mentioned settings without difficulty. The device was then allowed to cool for about 30 seconds and was removed. Hysteroscopy was then performed which revealed good global endometrial ablation and still no lesions. Hysteroscope and fluid were then removed. The single-tooth tenaculum was removed from the cervix. Bleeding was controlled with pressure. All instruments were then removed from the vagina. The patient tolerated the procedure  well and was taken to the recovery in stable condition. Counts were correct, she received no antibiotics, she had PAS hose on throughout the procedure.

## 2023-07-03 ENCOUNTER — Encounter (HOSPITAL_COMMUNITY): Payer: Self-pay | Admitting: Obstetrics and Gynecology

## 2023-07-03 LAB — SURGICAL PATHOLOGY

## 2023-07-03 NOTE — Anesthesia Postprocedure Evaluation (Signed)
 Anesthesia Post Note  Patient: Lorrianne Reyburn  Procedure(s) Performed: ABLATION, ENDOMETRIUM, HYSTEROSCOPIC, DILATION AND CURETTAGE (Vagina ) NOVASURE ABLATION (Vagina )     Patient location during evaluation: PACU Anesthesia Type: General Level of consciousness: awake and alert Pain management: pain level controlled Vital Signs Assessment: post-procedure vital signs reviewed and stable Respiratory status: spontaneous breathing, nonlabored ventilation and respiratory function stable Cardiovascular status: blood pressure returned to baseline and stable Postop Assessment: no apparent nausea or vomiting Anesthetic complications: no   No notable events documented.  Last Vitals:  Vitals:   07/02/23 1200 07/02/23 1215  BP: 135/80 130/89  Pulse: 74 85  Resp: 17 19  Temp:    SpO2: 100% 98%    Last Pain:  Vitals:   07/02/23 1211  TempSrc:   PainSc: 8                  Erin Havers

## 2023-07-14 ENCOUNTER — Other Ambulatory Visit (HOSPITAL_COMMUNITY): Payer: Self-pay

## 2023-07-14 MED ORDER — BUPRENORPHINE HCL 8 MG SL SUBL
8.0000 mg | SUBLINGUAL_TABLET | Freq: Two times a day (BID) | SUBLINGUAL | 0 refills | Status: DC
Start: 2023-07-13 — End: 2023-08-10
  Filled 2023-07-14: qty 56, 28d supply, fill #0

## 2023-08-10 ENCOUNTER — Other Ambulatory Visit (HOSPITAL_COMMUNITY): Payer: Self-pay

## 2023-08-10 MED ORDER — BUPRENORPHINE HCL 8 MG SL SUBL
8.0000 mg | SUBLINGUAL_TABLET | Freq: Two times a day (BID) | SUBLINGUAL | 0 refills | Status: DC
  Filled 2023-08-10: qty 56, 28d supply, fill #0

## 2023-09-07 ENCOUNTER — Other Ambulatory Visit (HOSPITAL_COMMUNITY): Payer: Self-pay

## 2023-09-07 MED ORDER — BUPRENORPHINE HCL 8 MG SL SUBL
8.0000 mg | SUBLINGUAL_TABLET | Freq: Two times a day (BID) | SUBLINGUAL | 0 refills | Status: DC
  Filled 2023-09-07: qty 56, 28d supply, fill #0

## 2023-10-05 ENCOUNTER — Other Ambulatory Visit (HOSPITAL_COMMUNITY): Payer: Self-pay

## 2023-10-05 MED ORDER — BUPRENORPHINE HCL 8 MG SL SUBL
8.0000 mg | SUBLINGUAL_TABLET | Freq: Two times a day (BID) | SUBLINGUAL | 0 refills | Status: DC
  Filled 2023-10-05: qty 56, 28d supply, fill #0

## 2023-11-02 ENCOUNTER — Other Ambulatory Visit (HOSPITAL_COMMUNITY): Payer: Self-pay

## 2023-11-02 MED ORDER — BUPRENORPHINE HCL 8 MG SL SUBL
8.0000 mg | SUBLINGUAL_TABLET | Freq: Two times a day (BID) | SUBLINGUAL | 0 refills | Status: DC
  Filled 2023-11-02: qty 56, 28d supply, fill #0

## 2023-11-30 ENCOUNTER — Other Ambulatory Visit (HOSPITAL_COMMUNITY): Payer: Self-pay

## 2023-11-30 MED ORDER — BUPRENORPHINE HCL 8 MG SL SUBL
8.0000 mg | SUBLINGUAL_TABLET | Freq: Two times a day (BID) | SUBLINGUAL | 0 refills | Status: DC
  Filled 2023-12-07: qty 56, 28d supply, fill #0

## 2023-12-07 ENCOUNTER — Other Ambulatory Visit (HOSPITAL_COMMUNITY): Payer: Self-pay

## 2023-12-28 ENCOUNTER — Other Ambulatory Visit (HOSPITAL_COMMUNITY): Payer: Self-pay

## 2023-12-28 MED ORDER — BUPRENORPHINE HCL 8 MG SL SUBL
8.0000 mg | SUBLINGUAL_TABLET | Freq: Two times a day (BID) | SUBLINGUAL | 0 refills | Status: DC
  Filled 2023-12-28 – 2024-01-04 (×2): qty 56, 28d supply, fill #0

## 2023-12-29 ENCOUNTER — Other Ambulatory Visit (HOSPITAL_COMMUNITY): Payer: Self-pay

## 2024-01-01 ENCOUNTER — Other Ambulatory Visit: Payer: Self-pay | Admitting: Obstetrics and Gynecology

## 2024-01-01 DIAGNOSIS — N644 Mastodynia: Secondary | ICD-10-CM

## 2024-01-04 ENCOUNTER — Other Ambulatory Visit (HOSPITAL_COMMUNITY): Payer: Self-pay

## 2024-01-22 ENCOUNTER — Encounter

## 2024-01-22 ENCOUNTER — Other Ambulatory Visit

## 2024-01-25 ENCOUNTER — Other Ambulatory Visit (HOSPITAL_COMMUNITY): Payer: Self-pay

## 2024-01-25 MED ORDER — BUPRENORPHINE HCL 8 MG SL SUBL
8.0000 mg | SUBLINGUAL_TABLET | Freq: Two times a day (BID) | SUBLINGUAL | 0 refills | Status: DC
  Filled 2024-01-25 – 2024-01-30 (×2): qty 56, 28d supply, fill #0

## 2024-01-30 ENCOUNTER — Other Ambulatory Visit (HOSPITAL_COMMUNITY): Payer: Self-pay

## 2024-02-01 ENCOUNTER — Inpatient Hospital Stay
Admission: RE | Admit: 2024-02-01 | Discharge: 2024-02-01 | Attending: Obstetrics and Gynecology | Admitting: Obstetrics and Gynecology

## 2024-02-01 DIAGNOSIS — N644 Mastodynia: Secondary | ICD-10-CM

## 2024-02-22 ENCOUNTER — Other Ambulatory Visit (HOSPITAL_COMMUNITY): Payer: Self-pay

## 2024-02-22 MED ORDER — BUPRENORPHINE HCL 8 MG SL SUBL
8.0000 mg | SUBLINGUAL_TABLET | Freq: Two times a day (BID) | SUBLINGUAL | 0 refills | Status: DC
  Filled 2024-02-22 – 2024-02-29 (×2): qty 56, 28d supply, fill #0

## 2024-02-23 ENCOUNTER — Other Ambulatory Visit (HOSPITAL_COMMUNITY): Payer: Self-pay

## 2024-02-29 ENCOUNTER — Other Ambulatory Visit (HOSPITAL_COMMUNITY): Payer: Self-pay

## 2024-03-21 ENCOUNTER — Other Ambulatory Visit (HOSPITAL_COMMUNITY): Payer: Self-pay

## 2024-03-21 MED ORDER — BUPRENORPHINE HCL 8 MG SL SUBL
8.0000 mg | SUBLINGUAL_TABLET | Freq: Two times a day (BID) | SUBLINGUAL | 0 refills | Status: AC
  Filled 2024-03-21: qty 56, 28d supply, fill #0
  Filled ????-??-??: fill #0
